# Patient Record
Sex: Male | Born: 1950 | Race: White | Hispanic: No | Marital: Married | Smoking: Never smoker
Health system: Southern US, Community
[De-identification: ages and names within clinical notes are randomized; demographics above are authoritative.]

## PROBLEM LIST (undated history)

## (undated) DIAGNOSIS — K219 Gastro-esophageal reflux disease without esophagitis: Secondary | ICD-10-CM

## (undated) DIAGNOSIS — E785 Hyperlipidemia, unspecified: Secondary | ICD-10-CM

## (undated) DIAGNOSIS — I10 Essential (primary) hypertension: Secondary | ICD-10-CM

## (undated) HISTORY — PX: CATARACT EXTRACTION: SUR2

## (undated) HISTORY — PX: POLYPECTOMY: SHX149

---

## 1998-09-16 ENCOUNTER — Emergency Department (HOSPITAL_COMMUNITY): Admission: EM | Admit: 1998-09-16 | Discharge: 1998-09-17 | Payer: Self-pay | Admitting: Internal Medicine

## 2003-03-28 ENCOUNTER — Ambulatory Visit (HOSPITAL_COMMUNITY): Admission: RE | Admit: 2003-03-28 | Discharge: 2003-03-28 | Payer: Self-pay | Admitting: *Deleted

## 2005-02-14 ENCOUNTER — Ambulatory Visit (HOSPITAL_COMMUNITY): Admission: RE | Admit: 2005-02-14 | Discharge: 2005-02-14 | Payer: Self-pay | Admitting: Ophthalmology

## 2005-02-16 ENCOUNTER — Emergency Department (HOSPITAL_COMMUNITY): Admission: RE | Admit: 2005-02-16 | Discharge: 2005-02-16 | Payer: Self-pay | Admitting: Ophthalmology

## 2011-07-08 ENCOUNTER — Encounter (HOSPITAL_COMMUNITY): Payer: Self-pay | Admitting: Emergency Medicine

## 2011-07-08 ENCOUNTER — Emergency Department (HOSPITAL_COMMUNITY)
Admission: EM | Admit: 2011-07-08 | Discharge: 2011-07-08 | Disposition: A | Discharge status: Home / Self Care | Payer: BC Managed Care – PPO | Attending: Emergency Medicine | Admitting: Emergency Medicine

## 2011-07-08 ENCOUNTER — Emergency Department (HOSPITAL_COMMUNITY): Payer: BC Managed Care – PPO

## 2011-07-08 DIAGNOSIS — I10 Essential (primary) hypertension: Secondary | ICD-10-CM | POA: Insufficient documentation

## 2011-07-08 DIAGNOSIS — R5383 Other fatigue: Secondary | ICD-10-CM | POA: Insufficient documentation

## 2011-07-08 DIAGNOSIS — H9190 Unspecified hearing loss, unspecified ear: Secondary | ICD-10-CM | POA: Insufficient documentation

## 2011-07-08 DIAGNOSIS — M62838 Other muscle spasm: Secondary | ICD-10-CM | POA: Insufficient documentation

## 2011-07-08 DIAGNOSIS — R5381 Other malaise: Secondary | ICD-10-CM | POA: Insufficient documentation

## 2011-07-08 DIAGNOSIS — Z79899 Other long term (current) drug therapy: Secondary | ICD-10-CM | POA: Insufficient documentation

## 2011-07-08 DIAGNOSIS — R6884 Jaw pain: Secondary | ICD-10-CM | POA: Insufficient documentation

## 2011-07-08 HISTORY — DX: Essential (primary) hypertension: I10

## 2011-07-08 LAB — CBC WITH DIFFERENTIAL/PLATELET
Basophils Absolute: 0 10*3/uL (ref 0.0–0.1)
Basophils Relative: 0 % (ref 0–1)
Eosinophils Absolute: 0.1 10*3/uL (ref 0.0–0.7)
Eosinophils Relative: 1 % (ref 0–5)
HCT: 40.7 % (ref 39.0–52.0)
Hemoglobin: 14.3 g/dL (ref 13.0–17.0)
Lymphocytes Relative: 23 % (ref 12–46)
Lymphs Abs: 2.2 10*3/uL (ref 0.7–4.0)
MCH: 32.1 pg (ref 26.0–34.0)
MCHC: 35.1 g/dL (ref 30.0–36.0)
MCV: 91.5 fL (ref 78.0–100.0)
Monocytes Absolute: 1.1 10*3/uL — ABNORMAL HIGH (ref 0.1–1.0)
Monocytes Relative: 12 % (ref 3–12)
Neutro Abs: 6 10*3/uL (ref 1.7–7.7)
Neutrophils Relative %: 64 % (ref 43–77)
Platelets: 176 10*3/uL (ref 150–400)
RBC: 4.45 MIL/uL (ref 4.22–5.81)
RDW: 12.5 % (ref 11.5–15.5)
WBC: 9.3 10*3/uL (ref 4.0–10.5)

## 2011-07-08 LAB — POCT I-STAT, CHEM 8
BUN: 21 mg/dL (ref 6–23)
Calcium, Ion: 1.16 mmol/L (ref 1.12–1.32)
Chloride: 102 mEq/L (ref 96–112)
Creatinine, Ser: 1.1 mg/dL (ref 0.50–1.35)
Glucose, Bld: 103 mg/dL — ABNORMAL HIGH (ref 70–99)
HCT: 43 % (ref 39.0–52.0)
Hemoglobin: 14.6 g/dL (ref 13.0–17.0)
Potassium: 3.7 mEq/L (ref 3.5–5.1)
Sodium: 137 mEq/L (ref 135–145)
TCO2: 22 mmol/L (ref 0–100)

## 2011-07-08 MED ORDER — HYDROCODONE-ACETAMINOPHEN 5-500 MG PO TABS: 1.0000 | ORAL_TABLET | Freq: Four times a day (QID) | ORAL | Status: AC | PRN

## 2011-07-08 MED ORDER — DIAZEPAM 5 MG/ML IJ SOLN
5.0000 mg | Freq: Once | INTRAMUSCULAR | Status: AC
  Administered 2011-07-08: 5 mg via INTRAVENOUS
  Filled 2011-07-08: qty 2

## 2011-07-08 MED ORDER — HYDROCODONE-ACETAMINOPHEN 5-325 MG PO TABS: 1.0000 | ORAL_TABLET | Freq: Once | ORAL | Status: DC

## 2011-07-08 MED ORDER — IOHEXOL 300 MG/ML  SOLN
75.0000 mL | Freq: Once | INTRAMUSCULAR | Status: AC | PRN
  Administered 2011-07-08: 75 mL via INTRAVENOUS

## 2011-07-08 MED ORDER — DIAZEPAM 5 MG PO TABS: 5.0000 mg | ORAL_TABLET | Freq: Three times a day (TID) | ORAL | Status: AC | PRN

## 2011-07-08 MED ORDER — DIAZEPAM 5 MG PO TABS: 5.0000 mg | ORAL_TABLET | Freq: Once | ORAL | Status: DC

## 2011-07-08 NOTE — Discharge Instructions (Signed)
Muscle Cramps Muscle cramps are when muscles tighten by themselves. Muscle cramps usually improve or go away within minutes. HOME CARE  Massage the muscle.   Stretch the muscle.   Relax the muscle.   Only take medicine as told by your doctor.   Drink enough fluids to keep your pee (urine) clear or pale yellow.  GET HELP RIGHT AWAY IF:  Cramps are frequent and do not get better with medicine. MAKE SURE YOU:  Understand these intructions.   Will watch your condition.   Will get help right away if your are not doing well or get worse.  Document Released: 12/10/2007 Document Revised: 12/16/2010 Document Reviewed: 12/19/2007 [facility] Patient Information 2012 [place], [place]. As discussed, we would like you to return in 24 hours for recheck, sooner if you develop new symptoms such as muscle spasm in your chest, muscles, increased difficulty swallowing.  Muscle spasms in her arms or legs

## 2011-07-08 NOTE — ED Notes (Signed)
Pt d/c home in NAD. Pt voiced understanding of d/c instructions and need for follow up care. Pt instructed not to drive on valium or narcotics.

## 2011-07-08 NOTE — ED Notes (Signed)
Co sore throat and difficulty swallowing x 2 days.  Sent from PCP for elevated WBC.

## 2011-07-08 NOTE — ED Provider Notes (Signed)
History     CSN: [id number]  Arrival date & time 07/08/11  1840   None     Chief Complaint  Patient presents with  . Sore Throat    (Consider location/radiation/quality/duration/timing/severity/associated sxs/prior treatment) HPI Comments: Patient has noticed for the last 2, days has been unable to fully open his mouth and he is having left sided jaw pain.  He did go to his dentist yesterday who did x-rays, revealing no dental abscess.  He was placed on amoxicillin by his dentist "just in case" he went to his primary care doctor because he was not getting any better and now the pain is in his ear, as well as into his neck.  He had blood work at the office, which revealed a WBC count of 12.1, and was sent to the emergency department for further evaluation.  His wife reports that he has been mumbling for the past 3 or 4 days, and feeling fatigued, which is very unusual for him.  He denies fever, chills, headache, dizziness, numbness, or tingling.  Injury to that side of his face or neck.  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 male presenting with pharyngitis. The history is provided by the patient.  Sore Throat This is a new problem. The current episode started in the past 7 days. The problem occurs constantly. The problem has been gradually worsening. Associated symptoms include fatigue and a sore throat. Pertinent negatives include no congestion, coughing, fever, headaches, joint swelling, myalgias, nausea, neck pain, numbness, visual change, vomiting or weakness. The symptoms are aggravated by swallowing. He has tried nothing for the symptoms. The treatment provided no relief.    Past Medical History  Diagnosis Date  . Hypertension     History reviewed. No pertinent past surgical history.  No family history on file.  History  Substance Use Topics  . Smoking status: Never Smoker   .  Smokeless tobacco: Not on file  . Alcohol Use: No      Review of Systems  Constitutional: Positive for fatigue. Negative for fever.  HENT: Positive for ear pain, sore throat and voice change. Negative for congestion, facial swelling, rhinorrhea, drooling, trouble swallowing, neck pain, neck stiffness, dental problem, sinus pressure, tinnitus and ear discharge.   Eyes: Negative for visual disturbance.  Respiratory: Negative for cough and shortness of breath.   Gastrointestinal: Negative for nausea and vomiting.  Musculoskeletal: Negative for myalgias and joint swelling.  Neurological: Negative for dizziness, facial asymmetry, speech difficulty, weakness, numbness and headaches.    Allergies  Review of patient's allergies indicates no known allergies.  Home Medications   Current Outpatient Rx  Name Route Sig Dispense Refill  . AMOXICILLIN 500 MG PO TABS Oral Take 500 mg by mouth 4 (four) times daily.    Marland Kitchen HYDROCHLOROTHIAZIDE 25 MG PO TABS Oral Take 25 mg by mouth daily.    Marland Kitchen LISINOPRIL 40 MG PO TABS Oral Take 40 mg by mouth 2 (two) times daily.    Marland Kitchen PANTOPRAZOLE SODIUM 40 MG PO TBEC Oral Take 40 mg by mouth 2 (two) times daily.    Marland Kitchen PRAVASTATIN SODIUM 40 MG PO TABS Oral Take 40 mg by mouth at bedtime.    . TRAMADOL HCL 50 MG PO TABS Oral Take 50 mg by mouth every 6 (six) hours as needed.      BP 108/88  Pulse 94  Temp  98.8 F (37.1 C)  Resp 24  SpO2 100%  Physical Exam  Constitutional: He is oriented to person, place, and time. He appears well-developed.  HENT:  Head: There is trismus in the jaw.  Right Ear: Hearing, tympanic membrane, external ear and ear canal normal.  Left Ear: Tympanic membrane, external ear and ear canal normal. Decreased hearing is noted.       Due to patient's inability to open his mouth.  He cannot visualize his posterior pharynx or uvula, or under the tongue  Eyes: Pupils are equal, round, and reactive to light.  Neck: Normal range of motion. No  JVD present. No tracheal deviation present. No thyromegaly present.  Cardiovascular: Normal rate.   Pulmonary/Chest: No stridor.  Abdominal: Soft.  Musculoskeletal: Normal range of motion.  Lymphadenopathy:    He has no cervical adenopathy.  Neurological: He is alert and oriented to person, place, and time.  Skin: Skin is warm.    ED Course  Procedures (including critical care time)  Labs Reviewed  POCT I-STAT, CHEM 8 - Abnormal; Notable for the following:    Glucose, Bld 103 (*)     All other components within normal limits  CBC WITH DIFFERENTIAL   No results found.   No diagnosis found.    MDM   Due to patient's physical exam slightly elevated white count.  Concern for a posterior pharyngeal abscess.  Subglottal mass.  Will obtain CT of neck with contrast  CT scan is normal, with both TMs in place.  Patient was given IV, Valium, without change in his symptoms.  This was discussed with Dr. Iantha Fallen.  I will send this patient home with pain medication, as well as by mouth Valium, and have him return in 24 hours for recheck.  Upon further investigation.  It was found that he did have a laceration to his thumb approximately 10 days ago and was updated on his tetanus but he is unsure when his tetanus before that was last updated there is a mild possibility that this patient has a mild form of tetanus he has been instructed to return if he develops any new symptoms.  Difficulty swallowing, pain in his anterior chest, or calves.  Severe.  Muscle spasms that are not controlled with Valium        Arman Filter, NP 07/08/11 2309

## 2011-07-08 NOTE — ED Provider Notes (Signed)
Pt seen and examined.  Medical screening examination/treatment/procedure(s) were conducted as a shared visit with non-physician practitioner(s) and myself.  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 I did consider the possibility of tetanus considering his jaw complaints however he has no other signs of muscle rigidity. He is not febrile. He does not appear toxic. He has had his tetanus vaccinations in the past and after the recent injury did have an update. Obviously his overall low risk. We will treat him for muscle spasms and have him return to the emergency room to be rechecked. At this time do not feel there is enough symptoms to warrant inpatient admission and treatment.  Celene Kras, MD 07/08/11 2300

## 2011-07-08 NOTE — ED Notes (Signed)
Per pt wife, neck is swollen. Pt has had difficulty opening mouth x2days. Pt c/o sore throat and states that it is painful to swallow. Pt denies any airway obstruction.

## 2011-07-09 ENCOUNTER — Emergency Department (HOSPITAL_BASED_OUTPATIENT_CLINIC_OR_DEPARTMENT_OTHER)
Admission: EM | Admit: 2011-07-09 | Discharge: 2011-07-09 | Disposition: A | Discharge status: Home / Self Care | Payer: BC Managed Care – PPO | Attending: Emergency Medicine | Admitting: Emergency Medicine

## 2011-07-09 ENCOUNTER — Encounter (HOSPITAL_BASED_OUTPATIENT_CLINIC_OR_DEPARTMENT_OTHER): Payer: Self-pay

## 2011-07-09 DIAGNOSIS — R6884 Jaw pain: Secondary | ICD-10-CM | POA: Insufficient documentation

## 2011-07-09 DIAGNOSIS — I10 Essential (primary) hypertension: Secondary | ICD-10-CM | POA: Insufficient documentation

## 2011-07-09 MED ORDER — DIAZEPAM 5 MG PO TABS
5.0000 mg | ORAL_TABLET | Freq: Once | ORAL | Status: AC
  Administered 2011-07-09: 5 mg via ORAL
  Filled 2011-07-09: qty 1

## 2011-07-09 NOTE — ED Provider Notes (Signed)
History     CSN: [id number]  Arrival date & time 07/09/11  1226   First MD Initiated Contact with Patient 07/09/11 1246      Chief Complaint  Patient presents with  . Jaw Pain    (Consider location/radiation/quality/duration/timing/severity/associated sxs/prior treatment) HPI Comments: Pt states that he has had jaw pain since 5 days ago:pt saw his dentist and was given amoxicillin pt then saw his pcp yesterday and had an elevated wbc and was sent to the er for further evaluation:pt denies fever or swelling:pt states in the er last night they did a ct scan:pt states that he took one dose of pain medication this morning and it doesn't necessarily feel better which is what brings him in today:pt states that he has left jaw tenderness when he tries to move the area:pt states that he has been able to eat it is just very sore  The history is provided by the patient. No language interpreter was used.    Past Medical History  Diagnosis Date  . Hypertension     Past Surgical History  Procedure Date  . Cataract extraction     No family history on file.  History  Substance Use Topics  . Smoking status: Never Smoker   . Smokeless tobacco: Not on file  . Alcohol Use: No      Review of Systems  Constitutional: Negative.   Respiratory: Negative.   Cardiovascular: Negative.     Allergies  Review of patient's allergies indicates no known allergies.  Home Medications   Current Outpatient Rx  Name Route Sig Dispense Refill  . AMOXICILLIN 500 MG PO TABS Oral Take 500 mg by mouth 4 (four) times daily.    Marland Kitchen DIAZEPAM 5 MG PO TABS Oral Take 1 tablet (5 mg total) by mouth every 8 (eight) hours as needed for anxiety. 10 tablet 0  . HYDROCHLOROTHIAZIDE 25 MG PO TABS Oral Take 25 mg by mouth daily.    Marland Kitchen LISINOPRIL 40 MG PO TABS Oral Take 40 mg by mouth 2 (two) times daily.    Marland Kitchen PANTOPRAZOLE SODIUM 40 MG PO TBEC Oral Take 40 mg by mouth 2 (two) times daily.    Marland Kitchen PRAVASTATIN SODIUM 40 MG  PO TABS Oral Take 40 mg by mouth at bedtime.    . TRAMADOL HCL 50 MG PO TABS Oral Take 50 mg by mouth every 6 (six) hours as needed.    Marland Kitchen HYDROCODONE-ACETAMINOPHEN 5-500 MG PO TABS Oral Take 1-2 tablets by mouth every 6 (six) hours as needed for pain. 15 tablet 0    BP 111/78  Pulse 102  Temp 98.2 F (36.8 C) (Oral)  Resp 18  Ht 5\' 8"  (1.727 m)  Wt 207 lb (93.895 kg)  BMI 31.47 kg/m2  SpO2 98%  Physical Exam  Nursing note and vitals reviewed. Constitutional: He is oriented to person, place, and time. He appears well-developed and well-nourished.  HENT:  Head: Normocephalic and atraumatic.  Right Ear: External ear normal.  Left Ear: External ear normal.       No gum or oropharyngeal swelling noted  Cardiovascular: Normal rate and regular rhythm.   Pulmonary/Chest: Effort normal and breath sounds normal.  Musculoskeletal: Normal range of motion.  Neurological: He is alert and oriented to person, place, and time.    ED Course  Procedures (including critical care time)  Labs Reviewed - No data to display    1. Jaw pain       MDM  Pt feeling  a little better with the valium:reviewed ct from last night:don't think further imaging is needed at this time:pt has valium and hydrocodone at home        Teressa Lower, NP 07/09/11 1503

## 2011-07-09 NOTE — ED Notes (Signed)
PT c/o jaw pain, sore throat and inability to open mouth.  Pt states onset of SX Tuesday. Pt seen at [facility] yesterday and told to FU with ED again in 24 hours.  Pt had accident with bilateral thumbs a couple of weeks ago, TX with Tetanus onset of SX approx 6 days later.

## 2011-07-13 NOTE — ED Provider Notes (Signed)
Medical screening examination/treatment/procedure(s) were conducted as a shared visit with non-physician practitioner(s) and myself.  I personally evaluated the patient during the encounter  Cyndra Numbers, MD 07/13/11 1325

## 2011-12-18 ENCOUNTER — Emergency Department (HOSPITAL_BASED_OUTPATIENT_CLINIC_OR_DEPARTMENT_OTHER): Payer: BC Managed Care – PPO

## 2011-12-18 ENCOUNTER — Encounter (HOSPITAL_BASED_OUTPATIENT_CLINIC_OR_DEPARTMENT_OTHER): Payer: Self-pay | Admitting: *Deleted

## 2011-12-18 ENCOUNTER — Emergency Department (HOSPITAL_BASED_OUTPATIENT_CLINIC_OR_DEPARTMENT_OTHER)
Admission: EM | Admit: 2011-12-18 | Discharge: 2011-12-18 | Disposition: A | Discharge status: Home / Self Care | Payer: BC Managed Care – PPO | Attending: Emergency Medicine | Admitting: Emergency Medicine

## 2011-12-18 DIAGNOSIS — Y92009 Unspecified place in unspecified non-institutional (private) residence as the place of occurrence of the external cause: Secondary | ICD-10-CM | POA: Insufficient documentation

## 2011-12-18 DIAGNOSIS — K529 Noninfective gastroenteritis and colitis, unspecified: Secondary | ICD-10-CM

## 2011-12-18 DIAGNOSIS — R55 Syncope and collapse: Secondary | ICD-10-CM | POA: Insufficient documentation

## 2011-12-18 DIAGNOSIS — Y93E8 Activity, other personal hygiene: Secondary | ICD-10-CM | POA: Insufficient documentation

## 2011-12-18 DIAGNOSIS — K5289 Other specified noninfective gastroenteritis and colitis: Secondary | ICD-10-CM | POA: Insufficient documentation

## 2011-12-18 DIAGNOSIS — S022XXA Fracture of nasal bones, initial encounter for closed fracture: Secondary | ICD-10-CM | POA: Insufficient documentation

## 2011-12-18 DIAGNOSIS — S0120XA Unspecified open wound of nose, initial encounter: Secondary | ICD-10-CM | POA: Insufficient documentation

## 2011-12-18 DIAGNOSIS — Z79899 Other long term (current) drug therapy: Secondary | ICD-10-CM | POA: Insufficient documentation

## 2011-12-18 DIAGNOSIS — S0121XA Laceration without foreign body of nose, initial encounter: Secondary | ICD-10-CM

## 2011-12-18 DIAGNOSIS — I10 Essential (primary) hypertension: Secondary | ICD-10-CM | POA: Insufficient documentation

## 2011-12-18 DIAGNOSIS — W1811XA Fall from or off toilet without subsequent striking against object, initial encounter: Secondary | ICD-10-CM | POA: Insufficient documentation

## 2011-12-18 LAB — CBC WITH DIFFERENTIAL/PLATELET
Eosinophils Relative: 1 % (ref 0–5)
HCT: 46.2 % (ref 39.0–52.0)
Hemoglobin: 16.7 g/dL (ref 13.0–17.0)
Lymphocytes Relative: 12 % (ref 12–46)
Lymphs Abs: 2 10*3/uL (ref 0.7–4.0)
MCV: 89.2 fL (ref 78.0–100.0)
Monocytes Absolute: 1 10*3/uL (ref 0.1–1.0)
RBC: 5.18 MIL/uL (ref 4.22–5.81)
WBC: 16.1 10*3/uL — ABNORMAL HIGH (ref 4.0–10.5)

## 2011-12-18 LAB — APTT: aPTT: 25 seconds (ref 24–37)

## 2011-12-18 LAB — COMPREHENSIVE METABOLIC PANEL
ALT: 13 U/L (ref 0–53)
CO2: 18 mEq/L — ABNORMAL LOW (ref 19–32)
Calcium: 9.5 mg/dL (ref 8.4–10.5)
Creatinine, Ser: 1 mg/dL (ref 0.50–1.35)
GFR calc Af Amer: >90 mL/min (ref >90)
GFR calc non Af Amer: 79 mL/min — ABNORMAL LOW (ref >90)
Glucose, Bld: 189 mg/dL — ABNORMAL HIGH (ref 70–99)
Sodium: 135 mEq/L (ref 135–145)

## 2011-12-18 MED ORDER — HYDROCODONE-ACETAMINOPHEN 5-325 MG PO TABS: 1.0000 | ORAL_TABLET | ORAL | Status: DC | PRN

## 2011-12-18 MED ORDER — CLINDAMYCIN HCL 150 MG PO CAPS: 300.0000 mg | ORAL_CAPSULE | Freq: Three times a day (TID) | ORAL | Status: DC

## 2011-12-18 MED ORDER — SODIUM CHLORIDE 0.9 % IV BOLUS (SEPSIS)
1000.0000 mL | Freq: Once | INTRAVENOUS | Status: AC
  Administered 2011-12-18: 1000 mL via INTRAVENOUS

## 2011-12-18 MED ORDER — ONDANSETRON 4 MG PO TBDP: 4.0000 mg | ORAL_TABLET | Freq: Three times a day (TID) | ORAL | Status: DC | PRN

## 2011-12-18 MED ORDER — LIDOCAINE HCL 2 % IJ SOLN
INTRAMUSCULAR | Status: AC
  Administered 2011-12-18: 400 mg
  Filled 2011-12-18: qty 20

## 2011-12-18 NOTE — ED Notes (Signed)
Patient ambulates halfway around the department without difficulty or assistance. Vital signs stable. No complaints of dizziness or nausea.

## 2011-12-18 NOTE — ED Notes (Signed)
Placed on 35% AFT for supplemental O2.  SpO2 increased to 97%.

## 2011-12-18 NOTE — ED Notes (Signed)
Patient transported to CT 

## 2011-12-18 NOTE — ED Notes (Signed)
Pt with nausea and diarrhea today- per pt's wife he passed out in the bathroom and hit face on door frame- laceration present to bridge of nose and right nare

## 2011-12-18 NOTE — ED Provider Notes (Signed)
History  This chart was scribed for Loren Racer, MD by Bennett Scrape, ED Scribe. This patient was seen in room MH07/MH07 and the patient's care was started at 5:50 PM.  CSN: [id number]  Arrival date & time 12/18/11  1732   First MD Initiated Contact with Patient 12/18/11 1750      Chief Complaint  Patient presents with  . Loss of Consciousness  . Facial Injury     Patient is a 61 y.o. male presenting with syncope. The history is provided by the patient. No language interpreter was used.  Loss of Consciousness This is a new problem. The current episode started 1 to 2 hours ago. Episode frequency: one episode today. The problem has been resolved. Pertinent negatives include no chest pain, no abdominal pain, no headaches and no shortness of breath. Nothing aggravates the symptoms. Nothing relieves the symptoms. He has tried nothing for the symptoms.    Johnathan [name] is a 61 y.o. male who presents to the Emergency Department complaining of sudden onset, non-changing, constant neck pain and lacerations to the bridge of his nose and right nare after one syncopal episode while on the toilet PTA. Per wife, pt passed out in the bathroom while sitting on the toilet while having diarrhea and hit his face on the door frame. She reports that the pt was unconscious about 1 to 2 minutes. The bleeding to the lacerations are controlled. Pt states that he remember sitting on the toilet having diarrhea and then waking up on the floor. He reports that he has been having nausea and watery diarrhea today. He denies any suspect food intakes and reports that his wife has similar symptoms of nausea and diarrhea at home. He reports prior episodes of syncope when having pain and/or BMs. He denies leg swelling, calf pain, emesis, CP and HA as associated symptoms.  He has a h/o HTN and denies smoking and alcohol use. TD vaccination is UTD, within the past year.  Past Medical History  Diagnosis Date  .  Hypertension     Past Surgical History  Procedure Date  . Cataract extraction     No family history on file.  History  Substance Use Topics  . Smoking status: Never Smoker   . Smokeless tobacco: Never Used  . Alcohol Use: No      Review of Systems  Constitutional: Negative for fever and chills.  HENT: Positive for neck pain. Negative for trouble swallowing.   Respiratory: Negative for shortness of breath.   Cardiovascular: Positive for syncope. Negative for chest pain.  Gastrointestinal: Positive for nausea and diarrhea. Negative for vomiting and abdominal pain.  Skin: Positive for wound (lacerations to the nose).  Neurological: Negative for headaches.  All other systems reviewed and are negative.    Allergies  Review of patient's allergies indicates no known allergies.  Home Medications   Current Outpatient Rx  Name  Route  Sig  Dispense  Refill  . HYDROCHLOROTHIAZIDE 25 MG PO TABS   Oral   Take 25 mg by mouth daily.         Marland Kitchen LISINOPRIL 40 MG PO TABS   Oral   Take 40 mg by mouth 2 (two) times daily.         Marland Kitchen PANTOPRAZOLE SODIUM 40 MG PO TBEC   Oral   Take 40 mg by mouth 2 (two) times daily.         Marland Kitchen PRAVASTATIN SODIUM 40 MG PO TABS   Oral  Take 40 mg by mouth at bedtime.         . TRAMADOL HCL 50 MG PO TABS   Oral   Take 50 mg by mouth every 6 (six) hours as needed.         . AMOXICILLIN 500 MG PO TABS   Oral   Take 500 mg by mouth 4 (four) times daily.         Marland Kitchen CLINDAMYCIN HCL 150 MG PO CAPS   Oral   Take 2 capsules (300 mg total) by mouth 3 (three) times daily.   21 capsule   0   . HYDROCODONE-ACETAMINOPHEN 5-325 MG PO TABS   Oral   Take 1 tablet by mouth every 4 (four) hours as needed for pain.   15 tablet   0   . ONDANSETRON 4 MG PO TBDP   Oral   Take 1 tablet (4 mg total) by mouth every 8 (eight) hours as needed for nausea.   20 tablet   0     Triage Vitals: BP 98/65  Pulse 87  Temp 97.8 F (36.6 C) (Oral)   Resp 20  SpO2 100%  Physical Exam  Nursing note and vitals reviewed. Constitutional: He is oriented to person, place, and time. He appears well-developed and well-nourished. No distress.  HENT:  Head: Normocephalic.       Abrasion to the mid lower lip, through and through 2 cm laceration of the right nare, irregular 2 cm laceration at the base of nose  Eyes: Conjunctivae normal and EOM are normal. Pupils are equal, round, and reactive to light.       Pupils are 2 mm and equal bilaterally  Neck: Neck supple. No tracheal deviation present.       Mild diffuse neck tenderness, no focal tenderness  Cardiovascular: Normal rate and regular rhythm.   No murmur heard. Pulmonary/Chest: Effort normal and breath sounds normal. No respiratory distress.  Abdominal: Soft. He exhibits no distension. There is no tenderness.  Musculoskeletal: Normal range of motion. He exhibits no edema.  Neurological: He is alert and oriented to person, place, and time.       Strength is 5/5 throughout, sensation is intact  Skin: Skin is warm and dry.  Psychiatric: He has a normal mood and affect. His behavior is normal.    ED Course  Procedures (including critical care time)   Date: 12/18/2011  Rate: 78  Rhythm: normal sinus rhythm  QRS Axis: normal  Intervals: normal  ST/T Wave abnormalities: nonspecific T wave changes  Conduction Disutrbances:none  Narrative Interpretation: nonspecific T wave changes  Old EKG Reviewed: none available   DIAGNOSTIC STUDIES: Oxygen Saturation is 100% on room air, normal by my interpretation.    COORDINATION OF CARE: 6:25 PM- Discussed treatment plan which includes CT scan with pt at bedside and pt agreed to plan. Advised pt that he may need to follow up with a plastic surgeon for his nose lacerations.   7:55 PM- Consult complete with Dr. Jenne Pane. Patient case explained and discussed. Dr. Jenne Pane recommends laceration repair in the ED and agrees to evaluate the patient in  office in 5 days. Call ended at 7:57 PM.   8:10 PM-Informed pt of consult with Dr. Jenne Pane and plan to perform laceration procedure. Pt agreed to plan.  LACERATION REPAIR Performed by: Loren Racer, MD at 8:15 PM Consent: Verbal consent obtained. Risks and benefits: risks, benefits and alternatives were discussed Patient identity confirmed: provided demographic data Time out performed  prior to procedure Prepped and Draped in normal sterile fashion Wound explored Laceration Location: right nare and base of nose Laceration Length: both are 2 cm No Foreign Bodies seen or palpated Anesthesia: local infiltration Local anesthetic: lidocaine 1 % without epinephrine Anesthetic total: 3 ml injected into the infraorbital region and supraorbital region, 1-2 mL located Irrigation method: syringe Amount of cleaning: standard Skin closure: well approximated  Number of sutures or staples: 3 5-0  polyprolene sutures and 3 6-0 prolene sutures to the laceration at the base of the nose, no active bleeding, re-approximated nasal aperture for right nare, used 5 6-0 prolene sutures, mild duskiness at distal end of nasal flap otherwise good vascular markings, no active bleeding Technique: interrupted  Patient tolerance: Patient tolerated the procedure well with no immediate complications.  Bacitracin  Applied. Advised pt to not blow nose and take antibiotics as prescribed. Follow up with ENT as prescribed in 5 days.  10:00 PM- Pt is up and ambulating around. He is requesting discharge.  Labs Reviewed  CBC WITH DIFFERENTIAL - Abnormal; Notable for the following:    WBC 16.1 (*)     MCHC 36.1 (*)     Neutrophils Relative 81 (*)     Neutro Abs 13.1 (*)     All other components within normal limits  COMPREHENSIVE METABOLIC PANEL - Abnormal; Notable for the following:    CO2 18 (*)     Glucose, Bld 189 (*)     Total Protein 8.6 (*)     GFR calc non Af Amer 79 (*)     All other components within normal  limits  PROTIME-INR  APTT  TROPONIN I  URINALYSIS, ROUTINE W REFLEX MICROSCOPIC   Ct Head Wo Contrast  12/18/2011  *RADIOLOGY REPORT*  Clinical Data:  Fall.  Facial trauma.  Loss of consciousness. Abrasions to the forehead and lacerations of the face.  CT HEAD WITHOUT CONTRAST CT MAXILLOFACIAL WITHOUT CONTRAST CT CERVICAL SPINE WITHOUT CONTRAST  Technique:  Multidetector CT imaging of the head, cervical spine, and maxillofacial structures were performed using the standard protocol without intravenous contrast. Multiplanar CT image reconstructions of the cervical spine and maxillofacial structures were also generated.  Comparison:  07/08/2011 soft tissue neck CT.  CT HEAD  Findings: No mass lesion, mass effect, midline shift, hydrocephalus, hemorrhage.  No territorial ischemia or acute infarction.  Calvarium intact.  Mastoid air cells clear. Mandibular condyles appear located.  Zygomatic arches intact.  The frontal sinuses are intact and clear.  There is a high attenuation subcutaneous lesion in the right posterior neck (image 3 series 2), which appears unchanged compared to recent CT 07/08/2011.  IMPRESSION: Negative CT brain.  CT MAXILLOFACIAL  Findings:  Bilateral mildly displaced nasal bone fractures are present with anterior position of the tip of the nasal bones relative to the the more superior nasal bones.  Nasal process of the maxilla appears intact.  Zygomatic arches intact.  Pterygoid plates intact.  Mandibular condyles located.  Significant dental artifact is present.  Laceration is present over the bridge of the nose.  Possible second laceration adjacent to the right nasal ala. Bilateral lens extractions.  Globes and orbits appear intact. Maxillary sinuses intact.  Small bilateral maxillary mucous retention cyst/polyp.  Mastoid air cells clear. Soft tissue swelling is present over the right side of the philtrum.  IMPRESSION: Facial lacerations and bilateral mildly displaced nasal bone fractures.   CT CERVICAL SPINE  Findings:   Cervical spinal alignment shows reversal at C3-C4. Multilevel  disc osteophyte complexes are present.  Bilateral uncovertebral spurring with multilevel foraminal encroachment. There is no cervical spine fracture or dislocation.  IMPRESSION: No acute abnormality.  Multilevel cervical spondylosis.   Original Report Authenticated By: Andreas Newport, M.D.    Ct Cervical Spine Wo Contrast  12/18/2011  *RADIOLOGY REPORT*  Clinical Data:  Fall.  Facial trauma.  Loss of consciousness. Abrasions to the forehead and lacerations of the face.  CT HEAD WITHOUT CONTRAST CT MAXILLOFACIAL WITHOUT CONTRAST CT CERVICAL SPINE WITHOUT CONTRAST  Technique:  Multidetector CT imaging of the head, cervical spine, and maxillofacial structures were performed using the standard protocol without intravenous contrast. Multiplanar CT image reconstructions of the cervical spine and maxillofacial structures were also generated.  Comparison:  07/08/2011 soft tissue neck CT.  CT HEAD  Findings: No mass lesion, mass effect, midline shift, hydrocephalus, hemorrhage.  No territorial ischemia or acute infarction.  Calvarium intact.  Mastoid air cells clear. Mandibular condyles appear located.  Zygomatic arches intact.  The frontal sinuses are intact and clear.  There is a high attenuation subcutaneous lesion in the right posterior neck (image 3 series 2), which appears unchanged compared to recent CT 07/08/2011.  IMPRESSION: Negative CT brain.  CT MAXILLOFACIAL  Findings:  Bilateral mildly displaced nasal bone fractures are present with anterior position of the tip of the nasal bones relative to the the more superior nasal bones.  Nasal process of the maxilla appears intact.  Zygomatic arches intact.  Pterygoid plates intact.  Mandibular condyles located.  Significant dental artifact is present.  Laceration is present over the bridge of the nose.  Possible second laceration adjacent to the right nasal ala. Bilateral lens  extractions.  Globes and orbits appear intact. Maxillary sinuses intact.  Small bilateral maxillary mucous retention cyst/polyp.  Mastoid air cells clear. Soft tissue swelling is present over the right side of the philtrum.  IMPRESSION: Facial lacerations and bilateral mildly displaced nasal bone fractures.  CT CERVICAL SPINE  Findings:   Cervical spinal alignment shows reversal at C3-C4. Multilevel disc osteophyte complexes are present.  Bilateral uncovertebral spurring with multilevel foraminal encroachment. There is no cervical spine fracture or dislocation.  IMPRESSION: No acute abnormality.  Multilevel cervical spondylosis.   Original Report Authenticated By: Andreas Newport, M.D.    Ct Maxillofacial Wo Cm  12/18/2011  *RADIOLOGY REPORT*  Clinical Data:  Fall.  Facial trauma.  Loss of consciousness. Abrasions to the forehead and lacerations of the face.  CT HEAD WITHOUT CONTRAST CT MAXILLOFACIAL WITHOUT CONTRAST CT CERVICAL SPINE WITHOUT CONTRAST  Technique:  Multidetector CT imaging of the head, cervical spine, and maxillofacial structures were performed using the standard protocol without intravenous contrast. Multiplanar CT image reconstructions of the cervical spine and maxillofacial structures were also generated.  Comparison:  07/08/2011 soft tissue neck CT.  CT HEAD  Findings: No mass lesion, mass effect, midline shift, hydrocephalus, hemorrhage.  No territorial ischemia or acute infarction.  Calvarium intact.  Mastoid air cells clear. Mandibular condyles appear located.  Zygomatic arches intact.  The frontal sinuses are intact and clear.  There is a high attenuation subcutaneous lesion in the right posterior neck (image 3 series 2), which appears unchanged compared to recent CT 07/08/2011.  IMPRESSION: Negative CT brain.  CT MAXILLOFACIAL  Findings:  Bilateral mildly displaced nasal bone fractures are present with anterior position of the tip of the nasal bones relative to the the more superior nasal  bones.  Nasal process of the maxilla appears intact.  Zygomatic arches intact.  Pterygoid plates intact.  Mandibular condyles located.  Significant dental artifact is present.  Laceration is present over the bridge of the nose.  Possible second laceration adjacent to the right nasal ala. Bilateral lens extractions.  Globes and orbits appear intact. Maxillary sinuses intact.  Small bilateral maxillary mucous retention cyst/polyp.  Mastoid air cells clear. Soft tissue swelling is present over the right side of the philtrum.  IMPRESSION: Facial lacerations and bilateral mildly displaced nasal bone fractures.  CT CERVICAL SPINE  Findings:   Cervical spinal alignment shows reversal at C3-C4. Multilevel disc osteophyte complexes are present.  Bilateral uncovertebral spurring with multilevel foraminal encroachment. There is no cervical spine fracture or dislocation.  IMPRESSION: No acute abnormality.  Multilevel cervical spondylosis.   Original Report Authenticated By: Andreas Newport, M.D.      1. Vasovagal syncope   2. Gastroenteritis   3. Nasal laceration   4. Nasal fracture       MDM  I personally performed the services described in this documentation, which was scribed in my presence. The recorded information has been reviewed and is accurate.  Pt ambulating without difficulty. States he is feeling much better. Advised to f/u with Dr Jenne Pane in 5 days and return for worsening symptoms or concerns.    Loren Racer, MD 12/18/11 [phone]

## 2012-02-29 ENCOUNTER — Ambulatory Visit (INDEPENDENT_AMBULATORY_CARE_PROVIDER_SITE_OTHER): Payer: BC Managed Care – PPO | Admitting: General Surgery

## 2012-02-29 ENCOUNTER — Encounter (INDEPENDENT_AMBULATORY_CARE_PROVIDER_SITE_OTHER): Payer: Self-pay | Admitting: General Surgery

## 2012-02-29 VITALS — BP 120/82 | HR 90 | Temp 98.6°F | Resp 18 | Ht 68.0 in | Wt 207.0 lb

## 2012-02-29 DIAGNOSIS — K648 Other hemorrhoids: Secondary | ICD-10-CM

## 2012-02-29 DIAGNOSIS — K644 Residual hemorrhoidal skin tags: Secondary | ICD-10-CM

## 2012-02-29 NOTE — Progress Notes (Signed)
Patient ID: Johnathan [name], male   DOB: 22-Sep-1950, 62 y.o.   MRN: [id number]  Chief Complaint  Patient presents with  . Hemorrhoids    HPI Johnathan [name] is a 62 y.o. male.  Chief complaint: Hemorrhoids HPI I was asked to see this gentleman in consultation regarding his hemorrhoids by Dr. Nicholos Johns. The patient said that for many years. He occasionally has pain. He has not had a lot of bleeding. He denies constipation. He usually just feels a mass back there when he is cleaning himself after bowel movements.  Past Medical History  Diagnosis Date  . Hypertension     Past Surgical History  Procedure Laterality Date  . Cataract extraction      No family history on file.  Social History History  Substance Use Topics  . Smoking status: Never Smoker   . Smokeless tobacco: Never Used  . Alcohol Use: No    No Known Allergies  Current Outpatient Prescriptions  Medication Sig Dispense Refill  . Glucosamine-Chondroit-Vit C-Mn (GLUCOSAMINE 1500 COMPLEX PO) Take by mouth.      . hydrochlorothiazide (HYDRODIURIL) 25 MG tablet Take 25 mg by mouth daily.      Marland Kitchen lisinopril (PRINIVIL,ZESTRIL) 40 MG tablet Take 40 mg by mouth 2 (two) times daily.      . pantoprazole (PROTONIX) 40 MG tablet Take 40 mg by mouth daily.       . pravastatin (PRAVACHOL) 40 MG tablet Take 40 mg by mouth at bedtime.       No current facility-administered medications for this visit.    Review of Systems Review of Systems  Constitutional: Negative for fever, chills and unexpected weight change.  HENT: Negative for hearing loss, congestion, sore throat, trouble swallowing and voice change.   Eyes: Negative for visual disturbance.  Respiratory: Negative for cough and wheezing.   Cardiovascular: Negative for chest pain, palpitations and leg swelling.  Gastrointestinal: Positive for rectal pain. Negative for nausea, vomiting, abdominal pain, diarrhea, constipation, blood in stool, abdominal distention and  anal bleeding.       Please see history of present illness  Genitourinary: Negative for hematuria and difficulty urinating.  Musculoskeletal: Negative for arthralgias.  Skin: Negative for rash and wound.  Neurological: Negative for seizures, syncope, weakness and headaches.  Hematological: Negative for adenopathy. Does not bruise/bleed easily.  Psychiatric/Behavioral: Negative for confusion.    Blood pressure 120/82, pulse 90, temperature 98.6 F (37 C), temperature source Oral, resp. rate 18, height 5\' 8"  (1.727 m), weight 207 lb (93.895 kg).  Physical Exam Physical Exam  Constitutional: He appears well-developed and well-nourished. No distress.  HENT:  Head: Normocephalic and atraumatic.  Mouth/Throat: No oropharyngeal exudate.  Eyes: EOM are normal. Pupils are equal, round, and reactive to light. No scleral icterus.  Neck: Normal range of motion. No tracheal deviation present.  Cardiovascular: Normal rate, regular rhythm, normal heart sounds and intact distal pulses.   Pulmonary/Chest: Effort normal and breath sounds normal. No stridor. No respiratory distress. He has no wheezes. He has no rales.  Abdominal: Soft. Bowel sounds are normal. He exhibits no distension. There is no tenderness. There is no rebound and no guarding.  External anal exam reveals large circumferential anal skin tag, no active external hemorrhoidal inflammation, small external hemorrhoids present, and endoscopy was then done. 2 columns internal hemorrhoids are present. No bleeding. No other masses noted  Musculoskeletal: Normal range of motion.  Neurological: He is alert.  Skin: Skin is warm.    Data Reviewed  Office notes from Dr. Nicholos Johns  Assessment    Internal hemorrhoids x2 columns, external hemorrhoids without acute inflammation, circumferential large anal skin tags    Plan    I have offered internal hemorrhoidectomy and excision of external anal skin tags. Procedure, risks, and benefits were  discussed in detail with the patient. He is agreeable, however, he is unable to take 4 weeks off of work at this time. I feel that will be important in his recovery. 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 His wife recently had surgery as well. He is going to check on his finances & ability to take time off work. He has agreed to call in within the next couple months to discuss scheduling. In the interim, he agrees to call if symptoms worsen.       Farrah Skoda E 02/29/2012, 10:42 AM

## 2012-03-13 ENCOUNTER — Ambulatory Visit (INDEPENDENT_AMBULATORY_CARE_PROVIDER_SITE_OTHER): Payer: Self-pay | Admitting: General Surgery

## 2015-03-04 ENCOUNTER — Telehealth: Payer: Self-pay | Admitting: Family Medicine

## 2015-03-04 ENCOUNTER — Ambulatory Visit (INDEPENDENT_AMBULATORY_CARE_PROVIDER_SITE_OTHER): Payer: BLUE CROSS/BLUE SHIELD | Admitting: Family Medicine

## 2015-03-04 ENCOUNTER — Encounter: Payer: Self-pay | Admitting: Family Medicine

## 2015-03-04 VITALS — Temp 99.3°F | Ht 68.0 in | Wt 205.0 lb

## 2015-03-04 DIAGNOSIS — J029 Acute pharyngitis, unspecified: Secondary | ICD-10-CM | POA: Diagnosis not present

## 2015-03-04 DIAGNOSIS — R52 Pain, unspecified: Secondary | ICD-10-CM

## 2015-03-04 LAB — POCT INFLUENZA A/B
Influenza A, POC: NEGATIVE
Influenza B, POC: NEGATIVE

## 2015-03-04 MED ORDER — FLUTICASONE PROPIONATE 50 MCG/ACT NA SUSP: 1.0000 | Freq: Two times a day (BID) | NASAL | Status: DC | PRN

## 2015-03-04 MED ORDER — OSELTAMIVIR PHOSPHATE 75 MG PO CAPS: 75.0000 mg | ORAL_CAPSULE | Freq: Two times a day (BID) | ORAL | Status: DC

## 2015-03-04 NOTE — Progress Notes (Signed)
Temp(Src) 99.3 F (37.4 C) (Oral)  Ht 5\' 8"  (1.727 m)  Wt 205 lb (92.987 kg)  BMI 31.18 kg/m2   Subjective:    Patient ID: Johnathan [name], male    DOB: 08-Jan-1951, 65 y.o.   MRN: [id number]  HPI: KORDELL FRANCZAK is a 65 y.o. male presenting on 03/04/2015 for Generalized Body Aches; Weakness; and Cough   HPI Sore throat and congestion and body aches Patient comes in today with a 2 day history of fevers and chills and body aches and generalized weakness and cough that is productive of yellow-green sputum. He said his wife has been ill previous to him. He does not know of anybody that's been specifically diagnosed with the flu. He denies any shortness of breath or wheezing. His fever 2 days ago was more subjective because he didn't actually take her temperature.  Relevant past medical, surgical, family and social history reviewed and updated as indicated. Interim medical history since our last visit reviewed. Allergies and medications reviewed and updated.  Review of Systems  Constitutional: Positive for fever and chills.  HENT: Positive for congestion, postnasal drip, rhinorrhea, sinus pressure and sore throat. Negative for ear discharge, ear pain, sneezing and voice change.   Eyes: Negative for pain, discharge, redness and visual disturbance.  Respiratory: Positive for cough. Negative for shortness of breath and wheezing.   Cardiovascular: Negative for chest pain and leg swelling.  Gastrointestinal: Negative for abdominal pain, diarrhea and constipation.  Genitourinary: Negative for difficulty urinating.  Musculoskeletal: Positive for myalgias. Negative for back pain and gait problem.  Skin: Negative for rash.  Neurological: Negative for syncope, light-headedness and headaches.  All other systems reviewed and are negative.   Per HPI unless specifically indicated above  Social History   Social History  . Marital Status: Married    Spouse Name: N/A  . Number of Children:  N/A  . Years of Education: N/A   Occupational History  . Not on file.   Social History Main Topics  . Smoking status: Never Smoker   . Smokeless tobacco: Never Used  . Alcohol Use: No  . Drug Use: No  . Sexual Activity: Not on file   Other Topics Concern  . Not on file   Social History Narrative    Past Surgical History  Procedure Laterality Date  . Cataract extraction      History reviewed. No pertinent family history.    Medication List       This list is accurate as of: 03/04/15  2:45 PM.  Always use your most recent med list.               fluticasone 50 MCG/ACT nasal spray  Commonly known as:  FLONASE  Place 1 spray into both nostrils 2 (two) times daily as needed for allergies or rhinitis.     GLUCOSAMINE 1500 COMPLEX PO  Take by mouth.     hydrochlorothiazide 25 MG tablet  Commonly known as:  HYDRODIURIL  Take 25 mg by mouth daily.     lisinopril 40 MG tablet  Commonly known as:  PRINIVIL,ZESTRIL  Take 40 mg by mouth 2 (two) times daily.     oseltamivir 75 MG capsule  Commonly known as:  TAMIFLU  Take 1 capsule (75 mg total) by mouth 2 (two) times daily.     pantoprazole 40 MG tablet  Commonly known as:  PROTONIX  Take 40 mg by mouth daily.     pravastatin 40 MG tablet  Commonly known as:  PRAVACHOL  Take 40 mg by mouth at bedtime.           Objective:    Temp(Src) 99.3 F (37.4 C) (Oral)  Ht 5\' 8"  (1.727 m)  Wt 205 lb (92.987 kg)  BMI 31.18 kg/m2  Wt Readings from Last 3 Encounters:  03/04/15 205 lb (92.987 kg)  02/29/12 207 lb (93.895 kg)  07/09/11 207 lb (93.895 kg)    Physical Exam  Constitutional: He is oriented to person, place, and time. He appears well-developed and well-nourished. No distress.  HENT:  Right Ear: Tympanic membrane, external ear and ear canal normal.  Left Ear: Tympanic membrane, external ear and ear canal normal.  Nose: Mucosal edema and rhinorrhea present. No sinus tenderness. No epistaxis. Right sinus  exhibits no maxillary sinus tenderness and no frontal sinus tenderness. Left sinus exhibits no maxillary sinus tenderness and no frontal sinus tenderness.  Mouth/Throat: Uvula is midline and mucous membranes are normal. Posterior oropharyngeal edema and posterior oropharyngeal erythema present. No oropharyngeal exudate or tonsillar abscesses.  Eyes: Conjunctivae and EOM are normal. Pupils are equal, round, and reactive to light. Right eye exhibits no discharge. No scleral icterus.  Cardiovascular: Normal rate, regular rhythm, normal heart sounds and intact distal pulses.   No murmur heard. Pulmonary/Chest: Effort normal and breath sounds normal. No respiratory distress. He has no wheezes.  Abdominal: He exhibits no distension.  Musculoskeletal: Normal range of motion. He exhibits no edema.  Neurological: He is alert and oriented to person, place, and time. Coordination normal.  Skin: Skin is warm and dry. No rash noted. He is not diaphoretic.  Psychiatric: He has a normal mood and affect. His behavior is normal.  Vitals reviewed.   Results for orders placed or performed in visit on 03/04/15  POCT Influenza A/B  Result Value Ref Range   Influenza A, POC Negative Negative   Influenza B, POC Negative Negative      Assessment & Plan:   Problem List Items Addressed This Visit    None    Visit Diagnoses    Body aches    -  Primary    Relevant Medications    oseltamivir (TAMIFLU) 75 MG capsule    fluticasone (FLONASE) 50 MCG/ACT nasal spray    Other Relevant Orders    POCT Influenza A/B (Completed)    Acute pharyngitis, unspecified etiology        Sounds like it could be the flu, will treat with Tamiflu and Flonase and Mucinex.    Relevant Medications    oseltamivir (TAMIFLU) 75 MG capsule    fluticasone (FLONASE) 50 MCG/ACT nasal spray        Follow up plan: Return in about 3 months (around 06/01/2015), or if symptoms worsen or fail to improve, for Hypertension and diabetes and  establish care.Caryl Pina, MD [facility] 03/04/2015, 2:45 PM

## 2015-03-04 NOTE — Telephone Encounter (Signed)
Both tamiflu and flonase sent to [facility] and pt is aware.

## 2015-03-16 ENCOUNTER — Telehealth: Payer: Self-pay | Admitting: Family Medicine

## 2015-03-16 MED ORDER — HYDROCODONE-HOMATROPINE 5-1.5 MG/5ML PO SYRP: 5.0000 mL | ORAL_SOLUTION | Freq: Three times a day (TID) | ORAL | Status: DC | PRN

## 2015-03-16 MED ORDER — BENZONATATE 200 MG PO CAPS: 200.0000 mg | ORAL_CAPSULE | Freq: Three times a day (TID) | ORAL | Status: DC | PRN

## 2015-03-16 NOTE — Telephone Encounter (Signed)
Tessalon Prescription sent to pharmacy  and cough syrup rx ready for pick up

## 2015-03-16 NOTE — Telephone Encounter (Signed)
Pt aware of all!! 

## 2015-03-16 NOTE — Telephone Encounter (Signed)
Any suggestions  - or NTBS again?

## 2015-10-28 ENCOUNTER — Ambulatory Visit: Payer: Self-pay | Admitting: General Surgery

## 2015-12-22 ENCOUNTER — Encounter (HOSPITAL_BASED_OUTPATIENT_CLINIC_OR_DEPARTMENT_OTHER): Payer: Self-pay | Admitting: *Deleted

## 2015-12-22 NOTE — Progress Notes (Signed)
   12/22/15 1538  OBSTRUCTIVE SLEEP APNEA  Have you ever been diagnosed with sleep apnea through a sleep study? No  Do you snore loudly (loud enough to be heard through closed doors)?  1  Do you often feel tired, fatigued, or sleepy during the daytime (such as falling asleep during driving or talking to someone)? 0  Has anyone observed you stop breathing during your sleep? 0  Do you have, or are you being treated for high blood pressure? 1  BMI more than 35 kg/m2? 0  Age > 50 (1-yes) 1  Neck circumference greater than:Male 16 inches or larger, Male 17inches or larger? 0  Male Gender (Yes=1) 1  Obstructive Sleep Apnea Score 4

## 2015-12-23 ENCOUNTER — Encounter (HOSPITAL_BASED_OUTPATIENT_CLINIC_OR_DEPARTMENT_OTHER)
Admission: RE | Admit: 2015-12-23 | Discharge: 2015-12-23 | Disposition: A | Discharge status: Home / Self Care | Payer: BLUE CROSS/BLUE SHIELD | Source: Ambulatory Visit | Attending: General Surgery | Admitting: General Surgery

## 2015-12-23 DIAGNOSIS — Z01818 Encounter for other preprocedural examination: Secondary | ICD-10-CM | POA: Insufficient documentation

## 2015-12-28 ENCOUNTER — Encounter (HOSPITAL_BASED_OUTPATIENT_CLINIC_OR_DEPARTMENT_OTHER)
Admission: RE | Disposition: A | Discharge status: Home / Self Care | Payer: Self-pay | Source: Ambulatory Visit | Attending: General Surgery

## 2015-12-28 ENCOUNTER — Ambulatory Visit (HOSPITAL_BASED_OUTPATIENT_CLINIC_OR_DEPARTMENT_OTHER): Payer: BLUE CROSS/BLUE SHIELD | Admitting: Anesthesiology

## 2015-12-28 ENCOUNTER — Encounter (HOSPITAL_BASED_OUTPATIENT_CLINIC_OR_DEPARTMENT_OTHER): Payer: Self-pay | Admitting: *Deleted

## 2015-12-28 ENCOUNTER — Ambulatory Visit (HOSPITAL_BASED_OUTPATIENT_CLINIC_OR_DEPARTMENT_OTHER)
Admission: RE | Admit: 2015-12-28 | Discharge: 2015-12-28 | Disposition: A | Discharge status: Home / Self Care | Payer: BLUE CROSS/BLUE SHIELD | Source: Ambulatory Visit | Attending: General Surgery | Admitting: General Surgery

## 2015-12-28 DIAGNOSIS — I10 Essential (primary) hypertension: Secondary | ICD-10-CM | POA: Diagnosis not present

## 2015-12-28 DIAGNOSIS — K644 Residual hemorrhoidal skin tags: Secondary | ICD-10-CM | POA: Insufficient documentation

## 2015-12-28 DIAGNOSIS — L918 Other hypertrophic disorders of the skin: Secondary | ICD-10-CM | POA: Diagnosis not present

## 2015-12-28 DIAGNOSIS — Z6830 Body mass index (BMI) 30.0-30.9, adult: Secondary | ICD-10-CM | POA: Diagnosis not present

## 2015-12-28 DIAGNOSIS — Z79899 Other long term (current) drug therapy: Secondary | ICD-10-CM | POA: Diagnosis not present

## 2015-12-28 DIAGNOSIS — E669 Obesity, unspecified: Secondary | ICD-10-CM | POA: Insufficient documentation

## 2015-12-28 DIAGNOSIS — E785 Hyperlipidemia, unspecified: Secondary | ICD-10-CM | POA: Diagnosis not present

## 2015-12-28 DIAGNOSIS — K219 Gastro-esophageal reflux disease without esophagitis: Secondary | ICD-10-CM | POA: Insufficient documentation

## 2015-12-28 HISTORY — DX: Hyperlipidemia, unspecified: E78.5

## 2015-12-28 HISTORY — DX: Gastro-esophageal reflux disease without esophagitis: K21.9

## 2015-12-28 HISTORY — PX: EXCISION OF SKIN TAG: SHX6270

## 2015-12-28 HISTORY — PX: HEMORRHOID SURGERY: SHX153

## 2015-12-28 SURGERY — EXCISION, SKIN TAG
Anesthesia: General

## 2015-12-28 MED ORDER — FENTANYL CITRATE (PF) 100 MCG/2ML IJ SOLN: 50.0000 ug | INTRAMUSCULAR | Status: DC | PRN

## 2015-12-28 MED ORDER — CEFAZOLIN SODIUM-DEXTROSE 2-4 GM/100ML-% IV SOLN
2.0000 g | INTRAVENOUS | Status: AC
  Administered 2015-12-28: 2 g via INTRAVENOUS

## 2015-12-28 MED ORDER — PROPOFOL 10 MG/ML IV BOLUS
INTRAVENOUS | Status: AC
  Filled 2015-12-28: qty 20

## 2015-12-28 MED ORDER — MIDAZOLAM HCL 2 MG/2ML IJ SOLN
INTRAMUSCULAR | Status: AC
  Filled 2015-12-28: qty 2

## 2015-12-28 MED ORDER — CEFAZOLIN SODIUM-DEXTROSE 2-4 GM/100ML-% IV SOLN
INTRAVENOUS | Status: AC
  Filled 2015-12-28: qty 100

## 2015-12-28 MED ORDER — LIDOCAINE 2% (20 MG/ML) 5 ML SYRINGE
INTRAMUSCULAR | Status: AC
  Filled 2015-12-28: qty 5

## 2015-12-28 MED ORDER — ONDANSETRON HCL 4 MG/2ML IJ SOLN
INTRAMUSCULAR | Status: AC
  Filled 2015-12-28: qty 2

## 2015-12-28 MED ORDER — LIDOCAINE HCL (CARDIAC) 20 MG/ML IV SOLN
INTRAVENOUS | Status: DC | PRN
  Administered 2015-12-28: 50 mg via INTRAVENOUS

## 2015-12-28 MED ORDER — FENTANYL CITRATE (PF) 100 MCG/2ML IJ SOLN: 25.0000 ug | INTRAMUSCULAR | Status: DC | PRN

## 2015-12-28 MED ORDER — EPHEDRINE SULFATE 50 MG/ML IJ SOLN
INTRAMUSCULAR | Status: DC | PRN
  Administered 2015-12-28: 10 mg via INTRAVENOUS

## 2015-12-28 MED ORDER — ONDANSETRON HCL 4 MG/2ML IJ SOLN: 4.0000 mg | Freq: Once | INTRAMUSCULAR | Status: DC | PRN

## 2015-12-28 MED ORDER — GLYCOPYRROLATE 0.2 MG/ML IJ SOLN: 0.2000 mg | Freq: Once | INTRAMUSCULAR | Status: DC | PRN

## 2015-12-28 MED ORDER — OXYCODONE HCL 5 MG PO TABS: 5.0000 mg | ORAL_TABLET | ORAL | 0 refills | Status: DC | PRN

## 2015-12-28 MED ORDER — SCOPOLAMINE 1 MG/3DAYS TD PT72: 1.0000 | MEDICATED_PATCH | Freq: Once | TRANSDERMAL | Status: DC | PRN

## 2015-12-28 MED ORDER — PROPOFOL 10 MG/ML IV BOLUS
INTRAVENOUS | Status: DC | PRN
  Administered 2015-12-28: 150 mg via INTRAVENOUS

## 2015-12-28 MED ORDER — DEXAMETHASONE SODIUM PHOSPHATE 4 MG/ML IJ SOLN
INTRAMUSCULAR | Status: DC | PRN
Start: 2015-12-28 — End: 2015-12-28
  Administered 2015-12-28: 10 mg via INTRAVENOUS

## 2015-12-28 MED ORDER — FENTANYL CITRATE (PF) 100 MCG/2ML IJ SOLN
INTRAMUSCULAR | Status: DC | PRN
  Administered 2015-12-28 (×2): 50 ug via INTRAVENOUS

## 2015-12-28 MED ORDER — CHLORHEXIDINE GLUCONATE CLOTH 2 % EX PADS: 6.0000 | MEDICATED_PAD | Freq: Once | CUTANEOUS | Status: DC

## 2015-12-28 MED ORDER — PHENYLEPHRINE 40 MCG/ML (10ML) SYRINGE FOR IV PUSH (FOR BLOOD PRESSURE SUPPORT)
PREFILLED_SYRINGE | INTRAVENOUS | Status: AC
  Filled 2015-12-28: qty 10

## 2015-12-28 MED ORDER — ONDANSETRON HCL 4 MG/2ML IJ SOLN
INTRAMUSCULAR | Status: DC | PRN
  Administered 2015-12-28: 4 mg via INTRAVENOUS

## 2015-12-28 MED ORDER — FENTANYL CITRATE (PF) 100 MCG/2ML IJ SOLN
INTRAMUSCULAR | Status: AC
  Filled 2015-12-28: qty 2

## 2015-12-28 MED ORDER — THROMBIN 5000 UNITS EX SOLR
CUTANEOUS | Status: DC | PRN
  Administered 2015-12-28: 5000 [IU] via TOPICAL

## 2015-12-28 MED ORDER — MIDAZOLAM HCL 2 MG/2ML IJ SOLN
INTRAMUSCULAR | Status: DC | PRN
  Administered 2015-12-28: 2 mg via INTRAVENOUS

## 2015-12-28 MED ORDER — DEXAMETHASONE SODIUM PHOSPHATE 10 MG/ML IJ SOLN
INTRAMUSCULAR | Status: AC
  Filled 2015-12-28: qty 1

## 2015-12-28 MED ORDER — EPHEDRINE 5 MG/ML INJ
INTRAVENOUS | Status: AC
  Filled 2015-12-28: qty 10

## 2015-12-28 MED ORDER — LACTATED RINGERS IV SOLN: 500.0000 mL | INTRAVENOUS | Status: DC

## 2015-12-28 MED ORDER — LACTATED RINGERS IV SOLN
INTRAVENOUS | Status: DC
  Administered 2015-12-28: 08:00:00 via INTRAVENOUS

## 2015-12-28 MED ORDER — MIDAZOLAM HCL 2 MG/2ML IJ SOLN: 1.0000 mg | INTRAMUSCULAR | Status: DC | PRN

## 2015-12-28 MED ORDER — BUPIVACAINE LIPOSOME 1.3 % IJ SUSP
INTRAMUSCULAR | Status: DC | PRN
  Administered 2015-12-28: 20 mL

## 2015-12-28 MED ORDER — LIDOCAINE 5 % EX OINT: 1.0000 "application " | TOPICAL_OINTMENT | CUTANEOUS | 0 refills | Status: DC | PRN

## 2015-12-28 MED ORDER — PHENYLEPHRINE HCL 10 MG/ML IJ SOLN
INTRAMUSCULAR | Status: DC | PRN
  Administered 2015-12-28: 40 ug via INTRAVENOUS

## 2015-12-28 SURGICAL SUPPLY — 40 items
BLADE SURG 11 STRL SS (BLADE) ×4 IMPLANT
BLADE SURG 15 STRL LF DISP TIS (BLADE) IMPLANT
BLADE SURG 15 STRL SS (BLADE)
CANISTER SUCT 1200ML W/VALVE (MISCELLANEOUS) ×4 IMPLANT
CLEANER CAUTERY TIP 5X5 PAD (MISCELLANEOUS) IMPLANT
DECANTER SPIKE VIAL GLASS SM (MISCELLANEOUS) ×1 IMPLANT
DRAPE UTILITY XL STRL (DRAPES) ×4 IMPLANT
DRSG EMULSION OIL 3X3 NADH (GAUZE/BANDAGES/DRESSINGS) IMPLANT
DRSG PAD ABDOMINAL 8X10 ST (GAUZE/BANDAGES/DRESSINGS) ×4 IMPLANT
ELECT REM PT RETURN 9FT ADLT (ELECTROSURGICAL) ×4
ELECTRODE REM PT RTRN 9FT ADLT (ELECTROSURGICAL) ×1 IMPLANT
GAUZE PETROLATUM 1 X8 (GAUZE/BANDAGES/DRESSINGS) ×4 IMPLANT
GLOVE BIO SURGEON STRL SZ8 (GLOVE) ×4 IMPLANT
GLOVE BIOGEL PI IND STRL 8 (GLOVE) ×2 IMPLANT
GLOVE BIOGEL PI INDICATOR 8 (GLOVE) ×2
GOWN STRL REUS W/ TWL LRG LVL3 (GOWN DISPOSABLE) ×2 IMPLANT
GOWN STRL REUS W/ TWL XL LVL3 (GOWN DISPOSABLE) ×2 IMPLANT
GOWN STRL REUS W/TWL LRG LVL3 (GOWN DISPOSABLE) ×4
GOWN STRL REUS W/TWL XL LVL3 (GOWN DISPOSABLE) ×4
NEEDLE HYPO 22GX1.5 SAFETY (NEEDLE) ×4 IMPLANT
PACK BASIN DAY SURGERY FS (CUSTOM PROCEDURE TRAY) ×4 IMPLANT
PACK LITHOTOMY IV (CUSTOM PROCEDURE TRAY) ×4 IMPLANT
PAD CLEANER CAUTERY TIP 5X5 (MISCELLANEOUS)
PENCIL BUTTON HOLSTER BLD 10FT (ELECTRODE) IMPLANT
SHEARS HARMONIC 9CM CVD (BLADE) ×3 IMPLANT
SPONGE GAUZE 4X4 12PLY STER LF (GAUZE/BANDAGES/DRESSINGS) ×8 IMPLANT
SPONGE SURGIFOAM ABS GEL 100 (HEMOSTASIS) IMPLANT
SURGILUBE 2OZ TUBE FLIPTOP (MISCELLANEOUS) ×16 IMPLANT
SUT CHROMIC 3 0 SH 27 (SUTURE) IMPLANT
SUT VIC AB 3-0 SH 27 (SUTURE) ×4
SUT VIC AB 3-0 SH 27X BRD (SUTURE) IMPLANT
SYR CONTROL 10ML LL (SYRINGE) ×4 IMPLANT
TOWEL OR 17X24 6PK STRL BLUE (TOWEL DISPOSABLE) ×8 IMPLANT
TOWEL OR NON WOVEN STRL DISP B (DISPOSABLE) ×4 IMPLANT
TRAY DSU PREP LF (CUSTOM PROCEDURE TRAY) ×4 IMPLANT
TRAY PROCTOSCOPIC FIBER OPTIC (SET/KITS/TRAYS/PACK) IMPLANT
TUBE CONNECTING 20'X1/4 (TUBING) ×1
TUBE CONNECTING 20X1/4 (TUBING) ×3 IMPLANT
UNDERPAD 30X30 (UNDERPADS AND DIAPERS) ×4 IMPLANT
YANKAUER SUCT BULB TIP NO VENT (SUCTIONS) ×4 IMPLANT

## 2015-12-28 NOTE — Anesthesia Preprocedure Evaluation (Addendum)
Anesthesia Evaluation  Patient identified by MRN, date of birth, ID band Patient awake    Reviewed: Allergy & Precautions, NPO status , Patient's Chart, lab work & pertinent test results  Airway Mallampati: III  TM Distance: >3 FB Neck ROM: Full    Dental  (+) Teeth Intact, Dental Advisory Given, Missing   Pulmonary neg pulmonary ROS,    Pulmonary exam normal breath sounds clear to auscultation       Cardiovascular hypertension, Pt. on medications Normal cardiovascular exam Rhythm:Regular Rate:Normal  HLD   Neuro/Psych negative neurological ROS  negative psych ROS   GI/Hepatic Neg liver ROS, GERD  Medicated and Controlled,  Endo/Other  Obesity   Renal/GU negative Renal ROS     Musculoskeletal negative musculoskeletal ROS (+)   Abdominal   Peds  Hematology negative hematology ROS (+)   Anesthesia Other Findings Day of surgery medications reviewed with the patient.  Reproductive/Obstetrics                            Anesthesia Physical Anesthesia Plan  ASA: II  Anesthesia Plan: General   Post-op Pain Management:    Induction: Intravenous  Airway Management Planned: LMA  Additional Equipment:   Intra-op Plan:   Post-operative Plan: Extubation in OR  Informed Consent: I have reviewed the patients History and Physical, chart, labs and discussed the procedure including the risks, benefits and alternatives for the proposed anesthesia with the patient or authorized representative who has indicated his/her understanding and acceptance.   Dental advisory given  Plan Discussed with: CRNA  Anesthesia Plan Comments: (Risks/benefits of general anesthesia discussed with patient including risk of damage to teeth, lips, gum, and tongue, nausea/vomiting, allergic reactions to medications, and the possibility of heart attack, stroke and death.  All patient questions answered.  Patient wishes  to proceed.)       Anesthesia Quick Evaluation

## 2015-12-28 NOTE — Interval H&P Note (Signed)
History and Physical Interval Note:  12/28/2015 8:55 AM  Johnathan [name]  has presented today for surgery, with the diagnosis of anal and left upper thigh skin tags  The various methods of treatment have been discussed with the patient and family. After consideration of risks, benefits and other options for treatment, the patient has consented to  Procedure(s): EXCISION OF ANAL  SKIN TAGS AND 3 SKIN TAGS LEFT THIGH (N/A) as a surgical intervention .  The patient's history has been reviewed, patient examined, no change in status, stable for surgery.  I have reviewed the patient's chart and labs.  Questions were answered to the patient's satisfaction.     Aleksey Newbern E

## 2015-12-28 NOTE — Anesthesia Postprocedure Evaluation (Signed)
Anesthesia Post Note  Patient: Johnathan [name]  Procedure(s) Performed: Procedure(s) (LRB): EXCISION OF ANAL  SKIN TAGS AND 3 SKIN TAGS LEFT THIGH (N/A) HEMORRHOIDECTOMY x2  Patient location during evaluation: PACU Anesthesia Type: General Level of consciousness: awake and alert Pain management: pain level controlled Vital Signs Assessment: post-procedure vital signs reviewed and stable Respiratory status: spontaneous breathing, nonlabored ventilation, respiratory function stable and patient connected to nasal cannula oxygen Cardiovascular status: blood pressure returned to baseline and stable Postop Assessment: no signs of nausea or vomiting Anesthetic complications: no    Last Vitals:  Vitals:   12/28/15 1045 12/28/15 1100  BP:  138/88  Pulse: 85 88  Resp: (!) 9 18  Temp:  36.5 C    Last Pain:  Vitals:   12/28/15 0814  TempSrc: Oral                 Catalina Gravel

## 2015-12-28 NOTE — H&P (Signed)
  Johnathan [name] 10/28/2015 11:33 AM Location: [facility] Patient #: [id number] DOB: 04-01-1950 Married / Language: Johnathan [name] / Race: White Male   History of Present Illness Johnathan Neri E. Grandville Silos MD; 10/28/2015 11:51 AM) The patient is a 65 year old male who presents with hemorrhoids. He underwent banding of internal hemorrhoid a couple weeks ago. He is doing well from that. No further itching or pain. He has not had any bleeding. He does continue to complain of the circumferential anal skin tags and would like to consider having this removed. Of note, he also mentions 3 small regular skin tags up on his inner left thigh which he would also like removed.   Allergies (Sonya Bynum, CMA; 10/28/2015 11:34 AM) No Known Drug Allergies 10/07/2015  Medication History (Sonya Bynum, CMA; 10/28/2015 11:34 AM) Glucosamine 1500 Complex (Oral) Active. HydroDiuril (25MG  Tablet, Oral) Active. Lisinopril (40MG  Tablet, Oral) Active. Protonix (40MG  Packet, Oral) Active. Pravastatin Sodium (40MG  Tablet, Oral) Active. Medications Reconciled  Vitals (Sonya Bynum CMA; 10/28/2015 11:34 AM) 10/28/2015 11:33 AM Weight: 200 lb Height: 68in Body Surface Area: 2.04 m Body Mass Index: 30.41 kg/m  Temp.: 37F(Temporal)  Pulse: 77 (Regular)  BP: 128/80 (Sitting, Left Arm, Standard)       Physical Exam Johnathan Neri E. Grandville Silos MD; 10/28/2015 11:53 AM) General Note: no distress   Integumentary Note: 3 small skin tags of upper inner left thigh, no abnormal pigmentation   Head and Neck Note: Normocephalic, neck is supple   Eye Note: Wears glasses, PERRLA   Chest and Lung Exam Note: Clear to auscultation bilaterally   Cardiovascular Note: Regular rate and rhythm   Abdomen Note: Soft, nontender   Male Genitourinary Note: Normal external genitalia   Rectal Note: Circumferential anal skin tags, digital rectal exam reveals resolution of internal  hemorrhoid previously palpable   Musculoskeletal Note: No deformity     Assessment & Plan Johnathan Neri E. Grandville Silos MD; 10/28/2015 11:54 AM) SKIN TAGS, ANUS OR RECTUM (K64.4) Impression: He would like to have these removed. I have offered to do this as an elective outpatient procedure. Procedure, risks, and benefits were discussed with him and I discussed the expected postoperative course. INTERNAL HEMORRHOIDS (K64.8) Impression: Improved status post banding SKIN TAG (L91.8) Impression: 3 skin tags of left upper inner thigh. We'll plan to remove at the same time as his anal skin tag surgery.  Update 12/28/15  No change in exam of anal skin tags or skin tags L thigh. I marked his site.  Georganna Skeans, MD, MPH, FACS Trauma: [phone] General Surgery: [phone]

## 2015-12-28 NOTE — Op Note (Signed)
12/28/2015  10:04 AM  PATIENT:  Johnathan [name]  65 y.o. male  PRE-OPERATIVE DIAGNOSIS:  anal and left upper thigh skin tags  POST-OPERATIVE DIAGNOSIS:  External hemorrhoid x2, anal and left upper thigh skin tags  PROCEDURE:  Procedure(s): EXCISION 4 SKIN TAGS LEFT THIGH EXCISION OF ANAL  SKIN TAGS  EXTERNAL HEMORRHOIDECTOMY x2  SURGEON:  Surgeon(s): Georganna Skeans, MD  ASSISTANTS: none   ANESTHESIA:   local and general  EBL:  Total I/O In: -  Out: 15 [Blood:15]  BLOOD ADMINISTERED:none  DRAINS: none   SPECIMEN:  Excision  DISPOSITION OF SPECIMEN:  PATHOLOGY  COUNTS:  YES  DICTATION: .Dragon Dictation Findings: External hemorrhoid 2, 4 left upper thigh skin tags  Procedure in detail: Azar presents for excision of left upper thigh skin tags and excision of anal skin tags. Informed consent was obtained. His site was marked. He received intravenous antibiotics. He was brought to the operating room and general anesthesia was administered with laryngeal mask airway. He was placed in lithotomy position. His upper thighs and anal area were prepped and draped in a sterile fashion. Timeout procedure was performed. Exparel was injected at the thigh locations as well as perianal region for post-op pain relief.Attention was directed first to the left upper thigh skin tags. There were actually a total of 4. These were each excised sharply and the base was cauterized. Once removed, Dermabond was placed on each side. Attention was then directed to the anal skin tags. Examination under anesthesia revealed directly to external hemorrhoids at the 10:00 and 7:00 position. Incision was made in the skin and these were excised using the harmonic scalpel. We stayed away from the sphincters. Both were removed completely, dissecting the vein back and vomiting with the harmonic scalpel. Next, the external skin tags from the right side were excised sharply and the harmonic was used to get good  hemostasis. Examination revealed no bleeding. The surrounding anoderm was closed in a few areas with interrupted 3-0 Vicryl sutures. The remainder small areas were left open. Further examination revealed excellent hemostasis and an endorectal dressing applied thrombin-soaked Gelfoam roll was placed. Sterile dressings were applied. All counts were correct. He tolerated the procedure without apparent complications taken recovery in stable condition. PATIENT DISPOSITION:  PACU - hemodynamically stable.   Delay start of Pharmacological VTE agent (>24hrs) due to surgical blood loss or risk of bleeding:  no  Georganna Skeans, MD, MPH, FACS Pager: [phone]  12/18/201710:04 AM

## 2015-12-28 NOTE — Anesthesia Procedure Notes (Signed)
Procedure Name: LMA Insertion Date/Time: 12/28/2015 9:24 AM Performed by: Rayvon Char Pre-anesthesia Checklist: Patient identified, Suction available, Patient being monitored and Emergency Drugs available Patient Re-evaluated:Patient Re-evaluated prior to inductionOxygen Delivery Method: Circle system utilized Preoxygenation: Pre-oxygenation with 100% oxygen Intubation Type: IV induction Ventilation: Mask ventilation without difficulty LMA: LMA inserted LMA Size: 4.0 Placement Confirmation: CO2 detector and breath sounds checked- equal and bilateral Dental Injury: Teeth and Oropharynx as per pre-operative assessment

## 2015-12-28 NOTE — Transfer of Care (Signed)
Immediate Anesthesia Transfer of Care Note  Patient: Johnathan [name]  Procedure(s) Performed: Procedure(s): EXCISION OF ANAL  SKIN TAGS AND 3 SKIN TAGS LEFT THIGH (N/A) HEMORRHOIDECTOMY x2  Patient Location: PACU  Anesthesia Type:General  Level of Consciousness: awake, alert  and oriented  Airway & Oxygen Therapy: Patient Spontanous Breathing and Patient connected to face mask oxygen  Post-op Assessment: Report given to RN and Post -op Vital signs reviewed and stable  Post vital signs: Reviewed and stable  Last Vitals:  Vitals:   12/28/15 1030 12/28/15 1038  BP: (!) 126/92   Pulse: 89 88  Resp: (!) 27 15  Temp:      Last Pain:  Vitals:   12/28/15 0814  TempSrc: Oral         Complications: No apparent anesthesia complications

## 2015-12-28 NOTE — Discharge Instructions (Signed)

## 2015-12-29 ENCOUNTER — Encounter (HOSPITAL_BASED_OUTPATIENT_CLINIC_OR_DEPARTMENT_OTHER): Payer: Self-pay | Admitting: General Surgery

## 2016-06-27 ENCOUNTER — Ambulatory Visit (INDEPENDENT_AMBULATORY_CARE_PROVIDER_SITE_OTHER): Payer: Medicare Other | Admitting: Family Medicine

## 2016-06-27 ENCOUNTER — Encounter: Payer: Self-pay | Admitting: Family Medicine

## 2016-06-27 VITALS — BP 135/84 | HR 79 | Temp 97.5°F | Ht 68.0 in | Wt 204.0 lb

## 2016-06-27 DIAGNOSIS — E669 Obesity, unspecified: Secondary | ICD-10-CM | POA: Diagnosis not present

## 2016-06-27 DIAGNOSIS — K219 Gastro-esophageal reflux disease without esophagitis: Secondary | ICD-10-CM

## 2016-06-27 DIAGNOSIS — E785 Hyperlipidemia, unspecified: Secondary | ICD-10-CM

## 2016-06-27 DIAGNOSIS — Z6831 Body mass index (BMI) 31.0-31.9, adult: Secondary | ICD-10-CM | POA: Diagnosis not present

## 2016-06-27 DIAGNOSIS — Z1159 Encounter for screening for other viral diseases: Secondary | ICD-10-CM | POA: Diagnosis not present

## 2016-06-27 DIAGNOSIS — I10 Essential (primary) hypertension: Secondary | ICD-10-CM | POA: Diagnosis not present

## 2016-06-27 MED ORDER — HYDROCHLOROTHIAZIDE 25 MG PO TABS: 25.0000 mg | ORAL_TABLET | Freq: Every day | ORAL | 2 refills | Status: DC

## 2016-06-27 MED ORDER — PRAVASTATIN SODIUM 40 MG PO TABS: 40.0000 mg | ORAL_TABLET | Freq: Every day | ORAL | 2 refills | Status: DC

## 2016-06-27 MED ORDER — ATORVASTATIN CALCIUM 40 MG PO TABS: 40.0000 mg | ORAL_TABLET | Freq: Every day | ORAL | 2 refills | Status: DC

## 2016-06-27 MED ORDER — LISINOPRIL 40 MG PO TABS: 40.0000 mg | ORAL_TABLET | Freq: Every day | ORAL | 2 refills | Status: DC

## 2016-06-27 MED ORDER — OMEPRAZOLE 40 MG PO CPDR: 40.0000 mg | DELAYED_RELEASE_CAPSULE | Freq: Every day | ORAL | 2 refills | Status: DC

## 2016-06-27 NOTE — Progress Notes (Signed)
BP 135/84   Pulse 79   Temp 97.5 F (36.4 C) (Oral)   Ht _0  (1.727 m)   Wt 204 lb (92.5 kg)   BMI 31.02 kg/m    Subjective:    Patient ID: Johnathan [name], male    DOB: Sep 27, 1950, 66 y.o.   MRN: [id number]  HPI: Johnathan [name] is a 66 y.o. male presenting on 06/27/2016 for Annual Exam (patient is fasting)   HPI Hyperlipidemia Patient is coming in for recheck of his hyperlipidemia. He is currently taking Pravastatin but says he is insurance does not want to pay for it and wants to be switched to something that'll pay for. He denies any issues with myalgias or history of liver damage from it. He denies any focal numbness or weakness or chest pain.   Hypertension Patient is currently on lisinopril, hydrochlorothiazide, and her blood pressure today is 135/84. Patient denies any lightheadedness or dizziness. Patient denies headaches, blurred vision, chest pains, shortness of breath, or weakness. Denies any side effects from medication and is content with current medication.   GERD recheck Patient has been on omeprazole 40 and says that he is doing very well and denies any major issues with it. He denies any reflux or blood in his stool. He denies any abdominal pain.  Relevant past medical, surgical, family and social history reviewed and updated as indicated. Interim medical history since our last visit reviewed. Allergies and medications reviewed and updated.  Review of Systems  Constitutional: Negative for chills and fever.  HENT: Negative for ear pain, rhinorrhea, sinus pain and sinus pressure.   Eyes: Negative for discharge.  Respiratory: Negative for cough, shortness of breath and wheezing.   Cardiovascular: Negative for chest pain and leg swelling.  Gastrointestinal: Negative for abdominal pain, constipation, diarrhea, nausea and vomiting.  Musculoskeletal: Negative for back pain and gait problem.  Skin: Negative for rash.  Neurological: Negative for dizziness,  weakness, light-headedness and numbness.  All other systems reviewed and are negative.   Per HPI unless specifically indicated above   Allergies as of 06/27/2016   No Known Allergies     Medication List       Accurate as of 06/27/16 10:51 AM. Always use your most recent med list.          GLUCOSAMINE 1500 COMPLEX PO Take by mouth.   hydrochlorothiazide 25 MG tablet Commonly known as:  HYDRODIURIL Take 1 tablet (25 mg total) by mouth daily.   lisinopril 40 MG tablet Commonly known as:  PRINIVIL,ZESTRIL Take 1 tablet (40 mg total) by mouth daily.   omeprazole 40 MG capsule Commonly known as:  PRILOSEC Take 1 capsule (40 mg total) by mouth daily.   oxyCODONE 5 MG immediate release tablet Commonly known as:  Oxy IR/ROXICODONE Take 1-2 tablets (5-10 mg total) by mouth every 4 (four) hours as needed for moderate pain or severe pain.   pravastatin 40 MG tablet Commonly known as:  PRAVACHOL Take 1 tablet (40 mg total) by mouth at bedtime.          Objective:    BP 135/84   Pulse 79   Temp 97.5 F (36.4 C) (Oral)   Ht _1  (1.727 m)   Wt 204 lb (92.5 kg)   BMI 31.02 kg/m   Wt Readings from Last 3 Encounters:  06/27/16 204 lb (92.5 kg)  12/28/15 206 lb (93.4 kg)  03/04/15 205 lb (93 kg)    Physical Exam  Constitutional: He  is oriented to person, place, and time. He appears well-developed and well-nourished. No distress.  HENT:  Right Ear: External ear normal.  Left Ear: External ear normal.  Nose: Nose normal.  Mouth/Throat: Oropharynx is clear and moist. No oropharyngeal exudate.  Eyes: Conjunctivae and EOM are normal. Pupils are equal, round, and reactive to light. Right eye exhibits no discharge. No scleral icterus.  Neck: Neck supple. No thyromegaly present.  Cardiovascular: Normal rate, regular rhythm, normal heart sounds and intact distal pulses.   No murmur heard. Pulmonary/Chest: Effort normal and breath sounds normal. No respiratory distress. He  has no wheezes.  Abdominal: Soft. Bowel sounds are normal. He exhibits no distension. There is no tenderness. There is no rebound and no guarding.  Genitourinary:  Genitourinary Comments: Patient says he is not having issues and declines exam today  Musculoskeletal: Normal range of motion. He exhibits no edema.  Lymphadenopathy:    He has no cervical adenopathy.  Neurological: He is alert and oriented to person, place, and time. Coordination normal.  Skin: Skin is warm and dry. No rash noted. He is not diaphoretic.  Psychiatric: He has a normal mood and affect. His behavior is normal.  Nursing note and vitals reviewed.       Assessment & Plan:   Problem List Items Addressed This Visit      Cardiovascular and Mediastinum   Hypertension - Primary   Relevant Medications   lisinopril (PRINIVIL,ZESTRIL) 40 MG tablet   hydrochlorothiazide (HYDRODIURIL) 25 MG tablet   atorvastatin (LIPITOR) 40 MG tablet   Other Relevant Orders   CMP14+EGFR     Digestive   GERD (gastroesophageal reflux disease)   Relevant Medications   omeprazole (PRILOSEC) 40 MG capsule   Other Relevant Orders   CBC with Differential/Platelet     Other   Obesity   Hyperlipidemia LDL goal <130   Relevant Medications   lisinopril (PRINIVIL,ZESTRIL) 40 MG tablet   hydrochlorothiazide (HYDRODIURIL) 25 MG tablet   atorvastatin (LIPITOR) 40 MG tablet   Other Relevant Orders   Lipid panel    Other Visit Diagnoses    Need for hepatitis C screening test       Relevant Orders   Hepatitis C antibody     Discussed obesity and weight loss and overall health changes, patient seems to be doing well or at least stable at this point. He says that he had a colonoscopy 2 years ago and they recommended one for next year, we'll request these records   Follow up plan: Return in about 6 months (around 12/27/2016), or if symptoms worsen or fail to improve, for Recheck cholesterol and hypertension.  Counseling provided for  all of the vaccine components Orders Placed This Encounter  Procedures  . CBC with Differential/Platelet  . CMP14+EGFR  . Lipid panel  . Hepatitis C antibody    Caryl Pina, MD [facility] 06/27/2016, 10:51 AM

## 2016-06-28 LAB — CMP14+EGFR
ALBUMIN: 4.4 g/dL (ref 3.6–4.8)
ALT: 11 IU/L (ref 0–44)
AST: 19 IU/L (ref 0–40)
Albumin/Globulin Ratio: 1.5 (ref 1.2–2.2)
Alkaline Phosphatase: 65 IU/L (ref 39–117)
BUN / CREAT RATIO: 18 (ref 10–24)
BUN: 18 mg/dL (ref 8–27)
Bilirubin Total: 0.2 mg/dL (ref 0.0–1.2)
CALCIUM: 9.2 mg/dL (ref 8.6–10.2)
CO2: 22 mmol/L (ref 20–29)
CREATININE: 0.98 mg/dL (ref 0.76–1.27)
Chloride: 100 mmol/L (ref 96–106)
GFR, EST AFRICAN AMERICAN: 92 mL/min/{1.73_m2} (ref >59)
GFR, EST NON AFRICAN AMERICAN: 80 mL/min/{1.73_m2} (ref >59)
Globulin, Total: 3 g/dL (ref 1.5–4.5)
Glucose: 114 mg/dL — ABNORMAL HIGH (ref 65–99)
Potassium: 3.9 mmol/L (ref 3.5–5.2)
SODIUM: 141 mmol/L (ref 134–144)
TOTAL PROTEIN: 7.4 g/dL (ref 6.0–8.5)

## 2016-06-28 LAB — CBC WITH DIFFERENTIAL/PLATELET
BASOS: 0 %
Basophils Absolute: 0 10*3/uL (ref 0.0–0.2)
EOS (ABSOLUTE): 0.1 10*3/uL (ref 0.0–0.4)
EOS: 2 %
HEMATOCRIT: 38.7 % (ref 37.5–51.0)
HEMOGLOBIN: 12.2 g/dL — AB (ref 13.0–17.7)
IMMATURE GRANS (ABS): 0 10*3/uL (ref 0.0–0.1)
IMMATURE GRANULOCYTES: 0 %
Lymphocytes Absolute: 1.8 10*3/uL (ref 0.7–3.1)
Lymphs: 32 %
MCH: 27.7 pg (ref 26.6–33.0)
MCHC: 31.5 g/dL (ref 31.5–35.7)
MCV: 88 fL (ref 79–97)
MONOCYTES: 9 %
Monocytes Absolute: 0.5 10*3/uL (ref 0.1–0.9)
NEUTROS PCT: 57 %
Neutrophils Absolute: 3.1 10*3/uL (ref 1.4–7.0)
Platelets: 190 10*3/uL (ref 150–379)
RBC: 4.4 x10E6/uL (ref 4.14–5.80)
RDW: 14.1 % (ref 12.3–15.4)
WBC: 5.6 10*3/uL (ref 3.4–10.8)

## 2016-06-28 LAB — LIPID PANEL
CHOL/HDL RATIO: 3.5 ratio (ref 0.0–5.0)
Cholesterol, Total: 121 mg/dL (ref 100–199)
HDL: 35 mg/dL — ABNORMAL LOW (ref >39)
LDL CALC: 72 mg/dL (ref 0–99)
TRIGLYCERIDES: 72 mg/dL (ref 0–149)
VLDL Cholesterol Cal: 14 mg/dL (ref 5–40)

## 2016-06-28 LAB — HEPATITIS C ANTIBODY: Hep C Virus Ab: <0.1 s/co ratio (ref 0.0–0.9)

## 2016-10-12 DIAGNOSIS — Z23 Encounter for immunization: Secondary | ICD-10-CM | POA: Diagnosis not present

## 2016-11-21 ENCOUNTER — Encounter: Payer: Self-pay | Admitting: *Deleted

## 2016-12-28 ENCOUNTER — Encounter: Payer: Self-pay | Admitting: Family Medicine

## 2016-12-28 ENCOUNTER — Ambulatory Visit (INDEPENDENT_AMBULATORY_CARE_PROVIDER_SITE_OTHER): Payer: Medicare Other | Admitting: Family Medicine

## 2016-12-28 VITALS — BP 128/85 | HR 72 | Temp 97.0°F | Ht 68.0 in | Wt 200.0 lb

## 2016-12-28 DIAGNOSIS — E785 Hyperlipidemia, unspecified: Secondary | ICD-10-CM | POA: Diagnosis not present

## 2016-12-28 DIAGNOSIS — K219 Gastro-esophageal reflux disease without esophagitis: Secondary | ICD-10-CM | POA: Diagnosis not present

## 2016-12-28 DIAGNOSIS — R7303 Prediabetes: Secondary | ICD-10-CM

## 2016-12-28 DIAGNOSIS — I10 Essential (primary) hypertension: Secondary | ICD-10-CM

## 2016-12-28 LAB — BAYER DCA HB A1C WAIVED: HB A1C: 6.1 % (ref <7.0)

## 2016-12-28 NOTE — Progress Notes (Signed)
BP 128/85   Pulse 72   Temp (!) 97 F (36.1 C) (Oral)   Ht '5\' 8"'$  (1.727 m)   Wt 200 lb (90.7 kg)   BMI 30.41 kg/m    Subjective:    Patient ID: Johnathan [name], male    DOB: 1950-10-22, 66 y.o.   MRN: [id number]  HPI: [place] KORVER is a 66 y.o. male presenting on 12/28/2016 for Hyperlipidemia (6 mo follow up; patient is fasting); Hypertension; and Diabetes   HPI Prediabetes Patient comes in today for recheck of his diabetes. Patient has been currently taking diet control. Patient is currently on an ACE inhibitor/ARB. Patient has seen an ophthalmologist this year. Patient denies any issues with their feet.  Hypertension Patient is currently on lisinopril and hydrochlorothiazide, and their blood pressure today is 128/85. Patient denies any lightheadedness or dizziness. Patient denies headaches, blurred vision, chest pains, shortness of breath, or weakness. Denies any side effects from medication and is content with current medication.   Hyperlipidemia Patient is coming in for recheck of his hyperlipidemia. The patient is currently taking Lipitor but says he is not taking. They deny any issues with myalgias or history of liver damage from it. They deny any focal numbness or weakness or chest pain.   GERD Patient is currently on omeprazole.  he denies any major symptoms or abdominal pain or belching or burping. She denies any blood in her stool or lightheadedness or dizziness.    Relevant past medical, surgical, family and social history reviewed and updated as indicated. Interim medical history since our last visit reviewed. Allergies and medications reviewed and updated.  Review of Systems  Constitutional: Negative for chills and fever.  Eyes: Negative for discharge.  Respiratory: Negative for shortness of breath and wheezing.   Cardiovascular: Negative for chest pain and leg swelling.  Gastrointestinal: Negative for abdominal pain.  Musculoskeletal: Negative for back pain  and gait problem.  Skin: Negative for rash.  Neurological: Negative for dizziness, weakness, light-headedness, numbness and headaches.  All other systems reviewed and are negative.   Per HPI unless specifically indicated above        Objective:    BP 128/85   Pulse 72   Temp (!) 97 F (36.1 C) (Oral)   Ht '5\' 8"'$  (1.727 m)   Wt 200 lb (90.7 kg)   BMI 30.41 kg/m   Wt Readings from Last 3 Encounters:  12/28/16 200 lb (90.7 kg)  06/27/16 204 lb (92.5 kg)  12/28/15 206 lb (93.4 kg)    Physical Exam  Constitutional: He is oriented to person, place, and time. He appears well-developed and well-nourished. No distress.  Eyes: Conjunctivae are normal. No scleral icterus.  Neck: Neck supple. No thyromegaly present.  Cardiovascular: Normal rate, regular rhythm, normal heart sounds and intact distal pulses.  No murmur heard. Pulmonary/Chest: Effort normal and breath sounds normal. No respiratory distress. He has no wheezes.  Abdominal: Soft. Bowel sounds are normal. He exhibits no distension. There is no tenderness.  Musculoskeletal: Normal range of motion. He exhibits no edema.  Lymphadenopathy:    He has no cervical adenopathy.  Neurological: He is alert and oriented to person, place, and time. Coordination normal.  Skin: Skin is warm and dry. No rash noted. He is not diaphoretic.  Psychiatric: He has a normal mood and affect. His behavior is normal.  Nursing note and vitals reviewed.       Assessment & Plan:   Problem List Items Addressed This Visit  Cardiovascular and Mediastinum   Hypertension - Primary   Relevant Medications   pravastatin (PRAVACHOL) 40 MG tablet   Other Relevant Orders   CMP14+EGFR     Digestive   GERD (gastroesophageal reflux disease)   Relevant Orders   CBC with Differential/Platelet     Other   Hyperlipidemia LDL goal <130   Relevant Medications   pravastatin (PRAVACHOL) 40 MG tablet   Other Relevant Orders   Lipid panel    Prediabetes   Relevant Orders   Bayer DCA Hb A1c Waived   CMP14+EGFR       Follow up plan: Return in about 6 months (around 06/28/2017), or if symptoms worsen or fail to improve, for Recheck hypertension and cholesterol.  Counseling provided for all of the vaccine components Orders Placed This Encounter  Procedures  . Bayer DCA Hb A1c Waived  . CMP14+EGFR  . Lipid panel  . CBC with Differential/Platelet    Caryl Pina, MD [facility] 12/28/2016, 11:13 AM

## 2016-12-29 LAB — CBC WITH DIFFERENTIAL/PLATELET
BASOS ABS: 0 10*3/uL (ref 0.0–0.2)
Basos: 0 %
EOS (ABSOLUTE): 0.1 10*3/uL (ref 0.0–0.4)
Eos: 1 %
Hematocrit: 40.1 % (ref 37.5–51.0)
Hemoglobin: 13.2 g/dL (ref 13.0–17.7)
IMMATURE GRANS (ABS): 0 10*3/uL (ref 0.0–0.1)
IMMATURE GRANULOCYTES: 0 %
LYMPHS: 30 %
Lymphocytes Absolute: 1.9 10*3/uL (ref 0.7–3.1)
MCH: 27.6 pg (ref 26.6–33.0)
MCHC: 32.9 g/dL (ref 31.5–35.7)
MCV: 84 fL (ref 79–97)
MONOS ABS: 0.5 10*3/uL (ref 0.1–0.9)
Monocytes: 8 %
NEUTROS PCT: 61 %
Neutrophils Absolute: 3.7 10*3/uL (ref 1.4–7.0)
PLATELETS: 212 10*3/uL (ref 150–379)
RBC: 4.79 x10E6/uL (ref 4.14–5.80)
RDW: 15.7 % — AB (ref 12.3–15.4)
WBC: 6.2 10*3/uL (ref 3.4–10.8)

## 2016-12-29 LAB — CMP14+EGFR
ALBUMIN: 4.6 g/dL (ref 3.6–4.8)
ALK PHOS: 76 IU/L (ref 39–117)
ALT: 14 IU/L (ref 0–44)
AST: 21 IU/L (ref 0–40)
Albumin/Globulin Ratio: 1.8 (ref 1.2–2.2)
BUN / CREAT RATIO: 17 (ref 10–24)
BUN: 16 mg/dL (ref 8–27)
Bilirubin Total: 0.3 mg/dL (ref 0.0–1.2)
CALCIUM: 9.4 mg/dL (ref 8.6–10.2)
CO2: 23 mmol/L (ref 20–29)
CREATININE: 0.96 mg/dL (ref 0.76–1.27)
Chloride: 103 mmol/L (ref 96–106)
GFR, EST AFRICAN AMERICAN: 94 mL/min/{1.73_m2} (ref >59)
GFR, EST NON AFRICAN AMERICAN: 81 mL/min/{1.73_m2} (ref >59)
GLOBULIN, TOTAL: 2.6 g/dL (ref 1.5–4.5)
Glucose: 120 mg/dL — ABNORMAL HIGH (ref 65–99)
Potassium: 3.7 mmol/L (ref 3.5–5.2)
SODIUM: 142 mmol/L (ref 134–144)
TOTAL PROTEIN: 7.2 g/dL (ref 6.0–8.5)

## 2016-12-29 LAB — LIPID PANEL
CHOL/HDL RATIO: 3.9 ratio (ref 0.0–5.0)
Cholesterol, Total: 136 mg/dL (ref 100–199)
HDL: 35 mg/dL — ABNORMAL LOW (ref >39)
LDL CALC: 81 mg/dL (ref 0–99)
Triglycerides: 98 mg/dL (ref 0–149)
VLDL CHOLESTEROL CAL: 20 mg/dL (ref 5–40)

## 2017-01-19 ENCOUNTER — Encounter: Payer: Medicare Other | Attending: Family Medicine | Admitting: Nutrition

## 2017-01-19 ENCOUNTER — Encounter: Payer: Self-pay | Admitting: Nutrition

## 2017-01-19 VITALS — Ht 68.0 in | Wt 202.0 lb

## 2017-01-19 DIAGNOSIS — E1165 Type 2 diabetes mellitus with hyperglycemia: Secondary | ICD-10-CM

## 2017-01-19 DIAGNOSIS — IMO0002 Reserved for concepts with insufficient information to code with codable children: Secondary | ICD-10-CM

## 2017-01-19 DIAGNOSIS — Z713 Dietary counseling and surveillance: Secondary | ICD-10-CM | POA: Diagnosis not present

## 2017-01-19 DIAGNOSIS — E119 Type 2 diabetes mellitus without complications: Secondary | ICD-10-CM | POA: Insufficient documentation

## 2017-01-19 DIAGNOSIS — E118 Type 2 diabetes mellitus with unspecified complications: Secondary | ICD-10-CM

## 2017-01-19 NOTE — Progress Notes (Signed)
  Medical Nutrition Therapy:  Appt start time: 1430 end time:  1600.  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 In November, [place] in [place], [place] with wife and she and he doe the shopping. MOst foods are baked and less fried.  Eats 2-3 meals per day. Snacks at night on nuts and fruit. HOH with hearing aide.  Preferred Learning Style:   No preference indicated   Learning Readiness: Ready  Change in progress   MEDICATIONS:    DIETARY INTAKE:    24-hr recall:  B ( 11-12 AM): eggs, link sausage 3 and grits.1 cup, Milk- 2% milk, 12 oz Snk ( AM):  D ( PM): Chicken wings 6, and salad- Ranch dressing,  Mello Yellow Snk ( PM): orange, nuts Beverages: sodas, water, sweet tea  Usual physical activity: Walking 2-3 timees per week. 3 miles  Estimated energy needs: 1800  calories 200  g carbohydrates 135 g protein  50 g fat  Progress Towards Goal(s):  In progress.   Nutritional Diagnosis:  NB-1.1 Food and nutrition-related knowledge deficit As related to DIabetes.  As evidenced by A1C 6.7%.    Intervention:   Nutrition and Diabetes education provided on My Plate, CHO counting, meal planning, portion sizes, timing of meals, avoiding snacks between meals unless having a low blood sugar, target ranges for A1C and blood sugars, signs/symptoms and treatment of hyper/hypoglycemia, monitoring blood sugars, taking medications as prescribed, benefits of exercising 30 minutes per day and prevention of complications of DM. Goals 1. Follow My Plate 2. Increase vegetables and fruit 3. Cut down on eating out and less processed foods 4. Eat breakfast daily. Down down on fat, fried foods and sodas/sweet tea. Continue to walk daily.  Teaching Method Utilized: Visual Auditory Hands on  Handouts given during visit include:  The Plate Method  Meal Plan Card  Diabetes Instructions.   Barriers to learning/adherence to lifestyle change: none  Demonstrated degree  of understanding via:  Teach Back   Monitoring/Evaluation:  Dietary intake, exercise, meal planning, SBG, and body weight in 3 month(s).

## 2017-01-19 NOTE — Progress Notes (Signed)
  Medical Nutrition Therapy:  Appt start time: 1430 end time:  1600.  Assessment:  Primary concerns today: Diabetes Type 2, A1C 6.7%. In November. Goes to the  [employer] in [place], [place]. Here with his wife., [place] with wife and she and he both do the shopping. Most foods are baked and less fried.  Eats 2-3 meals per day. Snacks at night on nuts and fruit. HOH with hearing aide. He likes whole milk and a lot of butter or fats on his foods.Prefers fried foods. Drinks some soda when eating out.  Walking 3-4 miles a few times per week. Engaged to make changes with his diet to improve his BS. PCP Dr. Glean Hess and A1C most recently was 6.1%.  Preferred Learning Style:   No preference indicated   Learning Readiness: Ready  Change in progress   MEDICATIONS:    DIETARY INTAKE:    24-hr recall:  B ( 11-12 AM): eggs, link sausage 3 and grits.1 cup, Milk- 2% milk, 12 oz Snk ( AM):  D ( PM): Chicken wings 6, and salad- Ranch dressing,  Mello Yellow Snk ( PM): orange, nuts Beverages: sodas, water, sweet tea  Usual physical activity: Walking 2-3 timees per week. 3 miles  Estimated energy needs: 1800  calories 200 g carbohydrates 135 g protein 50 g fat  Progress Towards Goal(s):  In progress.   Nutritional Diagnosis:  NB-1.1 Food and nutrition-related knowledge deficit As related to Diabetes Type 2.  As evidenced by  A1C 6.7%.    Intervention:  Nutrition and Diabetes education provided on My Plate, CHO counting, meal planning, portion sizes, timing of meals, avoiding snacks between meals unless having a low blood sugar, target ranges for A1C and blood sugars, signs/symptoms and treatment of hyper/hypoglycemia, monitoring blood sugars, taking medications as prescribed, benefits of exercising 30 minutes per day and prevention of complications of DM.  Goals 1. Follow My Plate 2. Increase vegetables and fruit 3. Cut down on eating out and less processed foods 4. Eat breakfast  daily. Down down on fat, fried foods and sodas/sweet tea. Continue to walk daily.  Teaching Method Utilized: Visual Auditory Hands on  Handouts given during visit include:  The Plate Method  Meal Plan  Diabetes Instructions.   Barriers to learning/adherence to lifestyle change: none  Demonstrated degree of understanding via:  Teach Back   Monitoring/Evaluation:  Dietary intake, exercise, meal planing, SBG, and body weight in 3 month(s).

## 2017-01-19 NOTE — Patient Instructions (Addendum)
Goals 1. Follow My Plate 2. Increase vegetables and fruit 3. Cut down on eating out and less processed foods 4. Eat breakfast daily. Down down on fat, fried foods and sodas/sweet tea. Continue to walk daily.

## 2017-03-16 ENCOUNTER — Other Ambulatory Visit: Payer: Self-pay | Admitting: Family Medicine

## 2017-03-16 DIAGNOSIS — K219 Gastro-esophageal reflux disease without esophagitis: Secondary | ICD-10-CM

## 2017-03-16 DIAGNOSIS — I10 Essential (primary) hypertension: Secondary | ICD-10-CM

## 2017-03-16 MED ORDER — PRAVASTATIN SODIUM 40 MG PO TABS: 40.0000 mg | ORAL_TABLET | Freq: Every day | ORAL | 3 refills | Status: DC

## 2017-03-16 MED ORDER — HYDROCHLOROTHIAZIDE 25 MG PO TABS
25.0000 mg | ORAL_TABLET | Freq: Every day | ORAL | 3 refills | Status: DC
Start: 2017-03-16 — End: 2018-04-06

## 2017-03-16 MED ORDER — LISINOPRIL 40 MG PO TABS
40.0000 mg | ORAL_TABLET | Freq: Every day | ORAL | 3 refills | Status: DC
Start: 2017-03-16 — End: 2018-03-20

## 2017-03-16 MED ORDER — OMEPRAZOLE 40 MG PO CPDR
40.0000 mg | DELAYED_RELEASE_CAPSULE | Freq: Every day | ORAL | 2 refills | Status: DC
Start: 2017-03-16 — End: 2017-11-29

## 2017-03-16 NOTE — Telephone Encounter (Signed)
Pt here recently - rf sent in / pt aware

## 2017-03-16 NOTE — Addendum Note (Signed)
Addended by: Zannie Cove on: 03/16/2017 03:52 PM   Modules accepted: Orders

## 2017-04-17 ENCOUNTER — Ambulatory Visit: Payer: BLUE CROSS/BLUE SHIELD | Admitting: Nutrition

## 2017-04-28 DIAGNOSIS — E785 Hyperlipidemia, unspecified: Secondary | ICD-10-CM | POA: Diagnosis not present

## 2017-04-28 DIAGNOSIS — J309 Allergic rhinitis, unspecified: Secondary | ICD-10-CM | POA: Diagnosis not present

## 2017-04-28 DIAGNOSIS — I951 Orthostatic hypotension: Secondary | ICD-10-CM | POA: Diagnosis not present

## 2017-04-28 DIAGNOSIS — K219 Gastro-esophageal reflux disease without esophagitis: Secondary | ICD-10-CM | POA: Diagnosis not present

## 2017-04-28 DIAGNOSIS — K08409 Partial loss of teeth, unspecified cause, unspecified class: Secondary | ICD-10-CM | POA: Diagnosis not present

## 2017-04-28 DIAGNOSIS — E669 Obesity, unspecified: Secondary | ICD-10-CM | POA: Diagnosis not present

## 2017-04-28 DIAGNOSIS — I10 Essential (primary) hypertension: Secondary | ICD-10-CM | POA: Diagnosis not present

## 2017-04-28 DIAGNOSIS — R609 Edema, unspecified: Secondary | ICD-10-CM | POA: Diagnosis not present

## 2017-04-28 DIAGNOSIS — G629 Polyneuropathy, unspecified: Secondary | ICD-10-CM | POA: Diagnosis not present

## 2017-04-28 DIAGNOSIS — S0502XD Injury of conjunctiva and corneal abrasion without foreign body, left eye, subsequent encounter: Secondary | ICD-10-CM | POA: Diagnosis not present

## 2017-06-14 ENCOUNTER — Ambulatory Visit: Payer: BLUE CROSS/BLUE SHIELD | Admitting: Nutrition

## 2017-06-28 ENCOUNTER — Ambulatory Visit: Payer: Medicare Other | Admitting: Family Medicine

## 2017-09-13 DIAGNOSIS — R69 Illness, unspecified: Secondary | ICD-10-CM | POA: Diagnosis not present

## 2017-10-14 DIAGNOSIS — R69 Illness, unspecified: Secondary | ICD-10-CM | POA: Diagnosis not present

## 2017-10-16 ENCOUNTER — Telehealth: Payer: Self-pay | Admitting: Family Medicine

## 2017-10-16 ENCOUNTER — Other Ambulatory Visit: Payer: Self-pay | Admitting: Nurse Practitioner

## 2017-10-16 ENCOUNTER — Encounter: Payer: Self-pay | Admitting: Nurse Practitioner

## 2017-10-16 ENCOUNTER — Ambulatory Visit (INDEPENDENT_AMBULATORY_CARE_PROVIDER_SITE_OTHER): Payer: Medicare HMO | Admitting: Nurse Practitioner

## 2017-10-16 VITALS — BP 119/76 | HR 85 | Temp 97.4°F | Ht 68.0 in | Wt 198.0 lb

## 2017-10-16 DIAGNOSIS — H60332 Swimmer's ear, left ear: Secondary | ICD-10-CM | POA: Diagnosis not present

## 2017-10-16 MED ORDER — OFLOXACIN 0.3 % OT SOLN
5.0000 [drp] | Freq: Every day | OTIC | 0 refills | Status: DC
Start: 2017-10-16 — End: 2018-07-02

## 2017-10-16 MED ORDER — NEOMYCIN-POLYMYXIN-HC 3.5-10000-1 OT SOLN: 3.0000 [drp] | Freq: Three times a day (TID) | OTIC | 0 refills | Status: DC

## 2017-10-16 MED ORDER — CIPROFLOXACIN-DEXAMETHASONE 0.3-0.1 % OT SUSP: 4.0000 [drp] | Freq: Two times a day (BID) | OTIC | 0 refills | Status: DC

## 2017-10-16 NOTE — Telephone Encounter (Signed)
Please review and advise.

## 2017-10-16 NOTE — Telephone Encounter (Signed)
Seen today  Too expensive  Can you change

## 2017-10-16 NOTE — Patient Instructions (Signed)
Otitis Externa Otitis externa is an infection of the outer ear canal. The outer ear canal is the area between the outside of the ear and the eardrum. Otitis externa is sometimes called "swimmer's ear." Follow these instructions at home:  If you were given antibiotic ear drops, use them as told by your doctor. Do not stop using them even if your condition gets better.  Take over-the-counter and prescription medicines only as told by your doctor.  Keep all follow-up visits as told by your doctor. This is important. How is this prevented?  Keep your ear dry. Use the corner of a towel to dry your ear after you swim or bathe.  Try not to scratch or put things in your ear. Doing these things makes it easier for germs to grow in your ear.  Avoid swimming in lakes, dirty water, or pools that may not have the right amount of a chemical called chlorine.  Consider making ear drops and putting 3 or 4 drops in each ear after you swim. Ask your doctor about how you can make ear drops. Contact a doctor if:  You have a fever.  After 3 days your ear is still red, swollen, or painful.  After 3 days you still have pus coming from your ear.  Your redness, swelling, or pain gets worse.  You have a really bad headache.  You have redness, swelling, pain, or tenderness behind your ear. This information is not intended to replace advice given to you by your health care provider. Make sure you discuss any questions you have with your health care provider. Document Released: 06/15/2007 Document Revised: 01/22/2015 Document Reviewed: 10/06/2014 Elsevier Interactive Patient Education  2018 [employer]vier Inc[employer]  

## 2017-10-16 NOTE — Progress Notes (Signed)
   Subjective:    Patient ID: Johnathan [name], male    DOB: 08-02-1950, 67 y.o.   MRN: [id number]   Chief Complaint: Ear Pain (left)   HPI Patient come sin with his wife c/o ear ache on left . Started a few days ago and is no better. Is worse at night.    Review of Systems  Constitutional: Negative.   HENT: Positive for ear pain.   Respiratory: Negative.   Cardiovascular: Negative.   Genitourinary: Negative.   Neurological: Negative.   Psychiatric/Behavioral: Negative.   All other systems reviewed and are negative.      Objective:   Physical Exam  Constitutional: He is oriented to person, place, and time. He appears well-nourished. He appears distressed (mild).  HENT:  Right Ear: Hearing, tympanic membrane, external ear and ear canal normal.  Left Ear: Tympanic membrane normal. There is drainage (yellowish excudtae), swelling (ear canal) and tenderness (tragus).  Nose: Nose normal.  Mouth/Throat: Uvula is midline, oropharynx is clear and moist and mucous membranes are normal.  Neck: Normal range of motion. Neck supple.  Cardiovascular: Normal rate and regular rhythm.  Pulmonary/Chest: Effort normal.  Abdominal: Soft. Bowel sounds are normal.  Neurological: He is alert and oriented to person, place, and time.  Skin: Skin is warm and dry.  Psychiatric: He has a normal mood and affect. His behavior is normal. Judgment and thought content normal.   BP 119/76   Pulse 85   Temp (!) 97.4 F (36.3 C) (Oral)   Ht 5\' 8"  (1.727 m)   Wt 198 lb (89.8 kg)   BMI 30.11 kg/m       Assessment & Plan:  1. Acute swimmer's ear of left side Avoid getting water in ear No q tips RTO prn  Meds ordered this encounter  Medications  . ciprofloxacin-dexamethasone (CIPRODEX) OTIC suspension    Sig: Place 4 drops into the left ear 2 (two) times daily.    Dispense:  7.5 mL    Refill:  0    Order Specific Question:   Supervising Provider    Answer:   Eustaquio Maize [[id number]]    Mary-Margaret Hassell Done, FNP

## 2017-10-16 NOTE — Telephone Encounter (Signed)
Left detailed message for pt regarding new RX

## 2017-10-16 NOTE — Telephone Encounter (Signed)
New rx sent to pharmacy

## 2017-10-24 ENCOUNTER — Ambulatory Visit (INDEPENDENT_AMBULATORY_CARE_PROVIDER_SITE_OTHER): Payer: Medicare HMO | Admitting: Family Medicine

## 2017-10-24 ENCOUNTER — Encounter: Payer: Self-pay | Admitting: Family Medicine

## 2017-10-24 VITALS — BP 121/76 | HR 73 | Temp 97.3°F | Ht 68.0 in | Wt 197.8 lb

## 2017-10-24 DIAGNOSIS — H60332 Swimmer's ear, left ear: Secondary | ICD-10-CM

## 2017-10-24 MED ORDER — NEOMYCIN-POLYMYXIN-HC 3.5-10000-1 OT SOLN: 3.0000 [drp] | Freq: Three times a day (TID) | OTIC | 0 refills | Status: AC

## 2017-10-24 NOTE — Progress Notes (Signed)
BP 121/76   Pulse 73   Temp (!) 97.3 F (36.3 C) (Oral)   Ht 5\' 8"  (1.727 m)   Wt 197 lb 12.8 oz (89.7 kg)   BMI 30.08 kg/m    Subjective:    Patient ID: Johnathan [name], male    DOB: [birth year], 67 y.o.   MRN: [id number]  HPI: Johnathan [name] is a 67 y.o. male presenting on 10/24/2017 for Ear Pain (Left- Patient was seen 10/7 and states it is no better.)   HPI Left ear pain Patient comes in complaining of left ear pain that is been going on for almost 2 weeks now.  He was seen on 10/17/1998 19 x 1 of my colleagues and prescribed drops for it but he says is not getting any better despite having been doing the drops as prescribed since that day.  Patient denies any fevers or chills or cough or congestion or anything else except pressure and drainage out of that left ear.  He feels like the drops are not getting in and that they just sit there and then come back out because his ear is so plugged up.  Relevant past medical, surgical, family and social history reviewed and updated as indicated. Interim medical history since our last visit reviewed. Allergies and medications reviewed and updated.  Review of Systems  Constitutional: Negative for chills and fever.  HENT: Positive for ear discharge and ear pain. Negative for congestion, rhinorrhea, sinus pressure, sinus pain and sore throat.   Respiratory: Negative for cough, shortness of breath and wheezing.   Cardiovascular: Negative for chest pain and leg swelling.  Musculoskeletal: Negative for back pain and gait problem.  Skin: Negative for rash.  All other systems reviewed and are negative.   Per HPI unless specifically indicated above   Allergies as of 10/24/2017   No Known Allergies     Medication List        Accurate as of 10/24/17  5:19 PM. Always use your most recent med list.          GLUCOSAMINE 1500 COMPLEX PO Take by mouth.   hydrochlorothiazide 25 MG tablet Commonly known as:  HYDRODIURIL Take 1 tablet  (25 mg total) by mouth daily.   lisinopril 40 MG tablet Commonly known as:  PRINIVIL,ZESTRIL Take 1 tablet (40 mg total) by mouth daily.   neomycin-polymyxin-hydrocortisone OTIC solution Commonly known as:  CORTISPORIN Place 3 drops into the left ear 3 (three) times daily for 10 days.   ofloxacin 0.3 % OTIC solution Commonly known as:  FLOXIN Place 5 drops into both ears daily.   omeprazole 40 MG capsule Commonly known as:  PRILOSEC Take 1 capsule (40 mg total) by mouth daily.   oxyCODONE 5 MG immediate release tablet Commonly known as:  Oxy IR/ROXICODONE Take 1-2 tablets (5-10 mg total) by mouth every 4 (four) hours as needed for moderate pain or severe pain.   pravastatin 40 MG tablet Commonly known as:  PRAVACHOL Take 1 tablet (40 mg total) by mouth daily.          Objective:    BP 121/76   Pulse 73   Temp (!) 97.3 F (36.3 C) (Oral)   Ht 5\' 8"  (1.727 m)   Wt 197 lb 12.8 oz (89.7 kg)   BMI 30.08 kg/m   Wt Readings from Last 3 Encounters:  10/24/17 197 lb 12.8 oz (89.7 kg)  10/16/17 198 lb (89.8 kg)  01/19/17 202 lb (91.6 kg)  Physical Exam  Constitutional: He is oriented to person, place, and time. He appears well-developed and well-nourished. No distress.  HENT:  Right Ear: Tympanic membrane, external ear and ear canal normal.  Left Ear: There is drainage and swelling (Unable to visualize tympanic membrane).  Eyes: Conjunctivae are normal. No scleral icterus.  Neck: Neck supple. No thyromegaly present.  Musculoskeletal: Normal range of motion. He exhibits no edema.  Lymphadenopathy:    He has no cervical adenopathy.  Neurological: He is alert and oriented to person, place, and time. Coordination normal.  Skin: Skin is warm and dry. No rash noted. He is not diaphoretic.  Psychiatric: He has a normal mood and affect. His behavior is normal.  Nursing note and vitals reviewed.   Cerumen lavage: Patient's ear is significantly congested due to cerumen and  discharge and purulence, will do lavage and then treat with drops from there.    Assessment & Plan:   Problem List Items Addressed This Visit    None    Visit Diagnoses    Acute swimmer's ear of left side    -  Primary   Significant amount of drainage that is blocking the tympanic membrane, drops not getting when the need to be, will do lavage and then drops       Follow up plan: Return if symptoms worsen or fail to improve.  Counseling provided for all of the vaccine components No orders of the defined types were placed in this encounter.   Caryl Pina, MD [facility] 10/24/2017, 5:19 PM

## 2017-10-31 ENCOUNTER — Telehealth: Payer: Self-pay | Admitting: Family Medicine

## 2017-10-31 DIAGNOSIS — H9209 Otalgia, unspecified ear: Secondary | ICD-10-CM

## 2017-11-01 NOTE — Telephone Encounter (Signed)
Go ahead and do a referral to ENT form, ear pain diagnosis

## 2017-11-01 NOTE — Telephone Encounter (Signed)
Referral placed.

## 2017-11-23 ENCOUNTER — Ambulatory Visit (INDEPENDENT_AMBULATORY_CARE_PROVIDER_SITE_OTHER): Payer: Medicare HMO | Admitting: Otolaryngology

## 2017-11-23 DIAGNOSIS — H903 Sensorineural hearing loss, bilateral: Secondary | ICD-10-CM

## 2017-11-23 DIAGNOSIS — H60332 Swimmer's ear, left ear: Secondary | ICD-10-CM

## 2017-11-29 ENCOUNTER — Other Ambulatory Visit: Payer: Self-pay | Admitting: Family Medicine

## 2017-11-29 DIAGNOSIS — K219 Gastro-esophageal reflux disease without esophagitis: Secondary | ICD-10-CM

## 2017-12-11 ENCOUNTER — Ambulatory Visit (INDEPENDENT_AMBULATORY_CARE_PROVIDER_SITE_OTHER): Payer: Medicare HMO | Admitting: Otolaryngology

## 2017-12-11 DIAGNOSIS — H903 Sensorineural hearing loss, bilateral: Secondary | ICD-10-CM | POA: Diagnosis not present

## 2017-12-11 DIAGNOSIS — H6122 Impacted cerumen, left ear: Secondary | ICD-10-CM | POA: Diagnosis not present

## 2017-12-11 DIAGNOSIS — H6983 Other specified disorders of Eustachian tube, bilateral: Secondary | ICD-10-CM

## 2017-12-29 ENCOUNTER — Encounter: Payer: Self-pay | Admitting: Family Medicine

## 2017-12-29 ENCOUNTER — Ambulatory Visit (INDEPENDENT_AMBULATORY_CARE_PROVIDER_SITE_OTHER): Payer: Medicare HMO | Admitting: Family Medicine

## 2017-12-29 VITALS — BP 139/82 | HR 73 | Temp 97.1°F | Ht 68.0 in | Wt 205.4 lb

## 2017-12-29 DIAGNOSIS — I10 Essential (primary) hypertension: Secondary | ICD-10-CM

## 2017-12-29 DIAGNOSIS — R3912 Poor urinary stream: Secondary | ICD-10-CM

## 2017-12-29 DIAGNOSIS — R7303 Prediabetes: Secondary | ICD-10-CM | POA: Diagnosis not present

## 2017-12-29 DIAGNOSIS — E785 Hyperlipidemia, unspecified: Secondary | ICD-10-CM | POA: Diagnosis not present

## 2017-12-29 DIAGNOSIS — N529 Male erectile dysfunction, unspecified: Secondary | ICD-10-CM

## 2017-12-29 DIAGNOSIS — K219 Gastro-esophageal reflux disease without esophagitis: Secondary | ICD-10-CM

## 2017-12-29 LAB — BAYER DCA HB A1C WAIVED: HB A1C (BAYER DCA - WAIVED): 6.4 % (ref <7.0)

## 2017-12-29 MED ORDER — SILDENAFIL CITRATE 20 MG PO TABS: 20.0000 mg | ORAL_TABLET | ORAL | 1 refills | Status: DC | PRN

## 2017-12-29 NOTE — Progress Notes (Signed)
BP 139/82   Pulse 73   Temp (!) 97.1 F (36.2 C) (Oral)   Ht '5\' 8"'$  (1.727 m)   Wt 205 lb 6.4 oz (93.2 kg)   BMI 31.23 kg/m    Subjective:    Patient ID: Johnathan [name], male    DOB: January 26, 1950, 67 y.o.   MRN: [id number]  HPI: Johnathan [name] is a 67 y.o. male presenting on 12/29/2017 for Hypertension (check up of chronic medical conditions)   HPI Prediabetes Patient comes in today for recheck of his diabetes. Patient has been currently taking no medication has been doing diet control and his last A1c was 6.1- 9 months ago. Patient is currently on an ACE inhibitor/ARB. Patient has not seen an ophthalmologist this year. Patient denies any issues with their feet.   Hypertension Patient is currently on lisinopril and hydrochlorothiazide, and their blood pressure today is 139/82. Patient denies any lightheadedness or dizziness. Patient denies headaches, blurred vision, chest pains, shortness of breath, or weakness. Denies any side effects from medication and is content with current medication.   Hyperlipidemia Patient is coming in for recheck of his hyperlipidemia. The patient is currently taking pravastatin. They deny any issues with myalgias or history of liver damage from it. They deny any focal numbness or weakness or chest pain.   GERD Patient is currently on Meprazole.  She denies any major symptoms or abdominal pain or belching or burping. She denies any blood in her stool or lightheadedness or dizziness.   Weak urinary stream and erectile dysfunction Patient has noticed over the past year that he has been having gradually worsening erectile dysfunction and trouble getting an erection and is also had increased urinary stream issues where his stream is just slower than what it has been before.  He denies any incomplete emptying and he only wakes up currently 1-2 times per night most of the time and occasionally 3 if he drinks a lot.  He would like to try medication for erectile  dysfunction but says the urinary stream issue is not bad enough to try medication for it at this point.  Relevant past medical, surgical, family and social history reviewed and updated as indicated. Interim medical history since our last visit reviewed. Allergies and medications reviewed and updated.  Review of Systems  Constitutional: Negative for chills and fever.  Eyes: Negative for discharge and visual disturbance.  Respiratory: Negative for shortness of breath and wheezing.   Cardiovascular: Negative for chest pain and leg swelling.  Genitourinary: Positive for decreased urine volume. Negative for difficulty urinating, dysuria, frequency, hematuria and urgency.  Musculoskeletal: Negative for back pain and gait problem.  Skin: Negative for rash.  Neurological: Negative for dizziness, weakness, light-headedness and headaches.  All other systems reviewed and are negative.   Per HPI unless specifically indicated above   Allergies as of 12/29/2017   No Known Allergies     Medication List       Accurate as of December 29, 2017 10:43 AM. Always use your most recent med list.        GLUCOSAMINE 1500 COMPLEX PO Take by mouth.   hydrochlorothiazide 25 MG tablet Commonly known as:  HYDRODIURIL Take 1 tablet (25 mg total) by mouth daily.   lisinopril 40 MG tablet Commonly known as:  PRINIVIL,ZESTRIL Take 1 tablet (40 mg total) by mouth daily.   ofloxacin 0.3 % OTIC solution Commonly known as:  FLOXIN OTIC Place 5 drops into both ears daily.   omeprazole  40 MG capsule Commonly known as:  PRILOSEC TAKE 1 CAPSULE BY MOUTH EVERY DAY   oxyCODONE 5 MG immediate release tablet Commonly known as:  Oxy IR/ROXICODONE Take 1-2 tablets (5-10 mg total) by mouth every 4 (four) hours as needed for moderate pain or severe pain.   pravastatin 40 MG tablet Commonly known as:  PRAVACHOL Take 1 tablet (40 mg total) by mouth daily.   sildenafil 20 MG tablet Commonly known as:   REVATIO Take 1-3 tablets (20-60 mg total) by mouth as needed.          Objective:    BP 139/82   Pulse 73   Temp (!) 97.1 F (36.2 C) (Oral)   Ht '5\' 8"'$  (1.727 m)   Wt 205 lb 6.4 oz (93.2 kg)   BMI 31.23 kg/m   Wt Readings from Last 3 Encounters:  12/29/17 205 lb 6.4 oz (93.2 kg)  10/24/17 197 lb 12.8 oz (89.7 kg)  10/16/17 198 lb (89.8 kg)    Physical Exam Vitals signs and nursing note reviewed.  Constitutional:      General: He is not in acute distress.    Appearance: He is well-developed. He is not diaphoretic.  Eyes:     General: No scleral icterus.    Conjunctiva/sclera: Conjunctivae normal.  Neck:     Musculoskeletal: Neck supple.     Thyroid: No thyromegaly.  Cardiovascular:     Rate and Rhythm: Normal rate and regular rhythm.     Heart sounds: Normal heart sounds. No murmur.  Pulmonary:     Effort: Pulmonary effort is normal. No respiratory distress.     Breath sounds: Normal breath sounds. No wheezing.  Musculoskeletal: Normal range of motion.  Lymphadenopathy:     Cervical: No cervical adenopathy.  Skin:    General: Skin is warm and dry.     Findings: No rash.  Neurological:     Mental Status: He is alert and oriented to person, place, and time.     Coordination: Coordination normal.  Psychiatric:        Behavior: Behavior normal.         Assessment & Plan:   Problem List Items Addressed This Visit      Cardiovascular and Mediastinum   Hypertension - Primary   Relevant Medications   sildenafil (REVATIO) 20 MG tablet   Other Relevant Orders   CMP14+EGFR     Digestive   GERD (gastroesophageal reflux disease)   Relevant Orders   CBC with Differential/Platelet     Other   Hyperlipidemia LDL goal <130   Relevant Medications   sildenafil (REVATIO) 20 MG tablet   Other Relevant Orders   Lipid panel   Prediabetes   Relevant Orders   CMP14+EGFR   Bayer DCA Hb A1c Waived    Other Visit Diagnoses    Weak urinary stream       Patient  says is not bad enough that he wants to try medication for it yet but we will check a PSA.   Relevant Orders   PSA, total and free   Erectile dysfunction, unspecified erectile dysfunction type       Relevant Medications   sildenafil (REVATIO) 20 MG tablet       Follow up plan: Return in about 6 months (around 06/30/2018), or if symptoms worsen or fail to improve, for Hypertension and cholesterol recheck.  Counseling provided for all of the vaccine components Orders Placed This Encounter  Procedures  . CBC with Differential/Platelet  .  CMP14+EGFR  . Lipid panel  . Bayer DCA Hb A1c Waived  . PSA, total and free    Caryl Pina, MD [facility] 12/29/2017, 10:43 AM

## 2017-12-30 LAB — CBC WITH DIFFERENTIAL/PLATELET
Basophils Absolute: 0 10*3/uL (ref 0.0–0.2)
Basos: 1 %
EOS (ABSOLUTE): 0.1 10*3/uL (ref 0.0–0.4)
Eos: 2 %
Hematocrit: 39.5 % (ref 37.5–51.0)
Hemoglobin: 12.9 g/dL — ABNORMAL LOW (ref 13.0–17.7)
IMMATURE GRANS (ABS): 0 10*3/uL (ref 0.0–0.1)
IMMATURE GRANULOCYTES: 0 %
LYMPHS ABS: 1.6 10*3/uL (ref 0.7–3.1)
LYMPHS: 28 %
MCH: 27.8 pg (ref 26.6–33.0)
MCHC: 32.7 g/dL (ref 31.5–35.7)
MCV: 85 fL (ref 79–97)
MONOCYTES: 9 %
Monocytes Absolute: 0.5 10*3/uL (ref 0.1–0.9)
Neutrophils Absolute: 3.5 10*3/uL (ref 1.4–7.0)
Neutrophils: 60 %
PLATELETS: 187 10*3/uL (ref 150–450)
RBC: 4.64 x10E6/uL (ref 4.14–5.80)
RDW: 14.5 % (ref 12.3–15.4)
WBC: 5.7 10*3/uL (ref 3.4–10.8)

## 2017-12-30 LAB — CMP14+EGFR
ALK PHOS: 74 IU/L (ref 39–117)
ALT: 15 IU/L (ref 0–44)
AST: 21 IU/L (ref 0–40)
Albumin/Globulin Ratio: 1.6 (ref 1.2–2.2)
Albumin: 4.4 g/dL (ref 3.6–4.8)
BUN/Creatinine Ratio: 10 (ref 10–24)
BUN: 11 mg/dL (ref 8–27)
Bilirubin Total: 0.5 mg/dL (ref 0.0–1.2)
CALCIUM: 9.2 mg/dL (ref 8.6–10.2)
CO2: 22 mmol/L (ref 20–29)
CREATININE: 1.05 mg/dL (ref 0.76–1.27)
Chloride: 103 mmol/L (ref 96–106)
GFR calc Af Amer: 84 mL/min/{1.73_m2} (ref >59)
GFR, EST NON AFRICAN AMERICAN: 73 mL/min/{1.73_m2} (ref >59)
GLUCOSE: 107 mg/dL — AB (ref 65–99)
Globulin, Total: 2.7 g/dL (ref 1.5–4.5)
Potassium: 3.8 mmol/L (ref 3.5–5.2)
Sodium: 141 mmol/L (ref 134–144)
Total Protein: 7.1 g/dL (ref 6.0–8.5)

## 2017-12-30 LAB — PSA, TOTAL AND FREE
PSA FREE PCT: 23.9 %
PSA FREE: 0.86 ng/mL
Prostate Specific Ag, Serum: 3.6 ng/mL (ref 0.0–4.0)

## 2017-12-30 LAB — LIPID PANEL
CHOL/HDL RATIO: 4.1 ratio (ref 0.0–5.0)
CHOLESTEROL TOTAL: 157 mg/dL (ref 100–199)
HDL: 38 mg/dL — AB (ref >39)
LDL CALC: 92 mg/dL (ref 0–99)
TRIGLYCERIDES: 135 mg/dL (ref 0–149)
VLDL Cholesterol Cal: 27 mg/dL (ref 5–40)

## 2018-01-20 ENCOUNTER — Other Ambulatory Visit: Payer: Self-pay | Admitting: Family Medicine

## 2018-01-20 DIAGNOSIS — N529 Male erectile dysfunction, unspecified: Secondary | ICD-10-CM

## 2018-01-24 ENCOUNTER — Other Ambulatory Visit: Payer: Self-pay | Admitting: Dermatology

## 2018-01-24 DIAGNOSIS — D234 Other benign neoplasm of skin of scalp and neck: Secondary | ICD-10-CM | POA: Diagnosis not present

## 2018-01-24 DIAGNOSIS — D485 Neoplasm of uncertain behavior of skin: Secondary | ICD-10-CM | POA: Diagnosis not present

## 2018-01-24 DIAGNOSIS — L111 Transient acantholytic dermatosis [Grover]: Secondary | ICD-10-CM | POA: Diagnosis not present

## 2018-03-13 ENCOUNTER — Other Ambulatory Visit: Payer: Self-pay | Admitting: Dermatology

## 2018-03-13 DIAGNOSIS — D485 Neoplasm of uncertain behavior of skin: Secondary | ICD-10-CM | POA: Diagnosis not present

## 2018-03-13 DIAGNOSIS — D3617 Benign neoplasm of peripheral nerves and autonomic nervous system of trunk, unspecified: Secondary | ICD-10-CM | POA: Diagnosis not present

## 2018-03-13 DIAGNOSIS — L821 Other seborrheic keratosis: Secondary | ICD-10-CM | POA: Diagnosis not present

## 2018-03-13 DIAGNOSIS — L111 Transient acantholytic dermatosis [Grover]: Secondary | ICD-10-CM | POA: Diagnosis not present

## 2018-03-19 ENCOUNTER — Other Ambulatory Visit: Payer: Self-pay | Admitting: Family Medicine

## 2018-03-19 DIAGNOSIS — I10 Essential (primary) hypertension: Secondary | ICD-10-CM

## 2018-04-06 ENCOUNTER — Other Ambulatory Visit: Payer: Self-pay | Admitting: Family Medicine

## 2018-04-06 DIAGNOSIS — I10 Essential (primary) hypertension: Secondary | ICD-10-CM

## 2018-05-16 ENCOUNTER — Other Ambulatory Visit: Payer: Self-pay | Admitting: Family Medicine

## 2018-05-25 ENCOUNTER — Other Ambulatory Visit: Payer: Self-pay | Admitting: Family Medicine

## 2018-05-25 DIAGNOSIS — K219 Gastro-esophageal reflux disease without esophagitis: Secondary | ICD-10-CM

## 2018-06-15 ENCOUNTER — Other Ambulatory Visit: Payer: Self-pay | Admitting: Family Medicine

## 2018-06-15 DIAGNOSIS — I10 Essential (primary) hypertension: Secondary | ICD-10-CM

## 2018-06-28 ENCOUNTER — Other Ambulatory Visit: Payer: Self-pay | Admitting: Family Medicine

## 2018-06-28 DIAGNOSIS — I10 Essential (primary) hypertension: Secondary | ICD-10-CM

## 2018-07-02 ENCOUNTER — Other Ambulatory Visit: Payer: Self-pay

## 2018-07-02 ENCOUNTER — Encounter: Payer: Self-pay | Admitting: Family Medicine

## 2018-07-02 ENCOUNTER — Ambulatory Visit (INDEPENDENT_AMBULATORY_CARE_PROVIDER_SITE_OTHER): Payer: Medicare HMO | Admitting: Family Medicine

## 2018-07-02 DIAGNOSIS — E785 Hyperlipidemia, unspecified: Secondary | ICD-10-CM | POA: Diagnosis not present

## 2018-07-02 DIAGNOSIS — K219 Gastro-esophageal reflux disease without esophagitis: Secondary | ICD-10-CM

## 2018-07-02 DIAGNOSIS — R7303 Prediabetes: Secondary | ICD-10-CM

## 2018-07-02 DIAGNOSIS — I10 Essential (primary) hypertension: Secondary | ICD-10-CM

## 2018-07-02 MED ORDER — PRAVASTATIN SODIUM 40 MG PO TABS: 40.0000 mg | ORAL_TABLET | Freq: Every day | ORAL | 3 refills | Status: DC

## 2018-07-02 MED ORDER — LISINOPRIL 40 MG PO TABS: 40.0000 mg | ORAL_TABLET | Freq: Every day | ORAL | 3 refills | Status: DC

## 2018-07-02 MED ORDER — OMEPRAZOLE 40 MG PO CPDR: 40.0000 mg | DELAYED_RELEASE_CAPSULE | Freq: Every day | ORAL | 3 refills | Status: DC

## 2018-07-02 MED ORDER — HYDROCHLOROTHIAZIDE 25 MG PO TABS: 25.0000 mg | ORAL_TABLET | Freq: Every day | ORAL | 3 refills | Status: DC

## 2018-07-02 NOTE — Progress Notes (Signed)
Virtual Visit via telephone Note  I connected with Johnathan [name] on 07/02/18 at 0931 by telephone and verified that I am speaking with the correct person using two identifiers. Johnathan [name] is currently located at home and no other people are currently with her during visit. The provider, Fransisca Kaufmann Dettinger, MD is located in their office at time of visit.  Call ended at [phone]  I discussed the limitations, risks, security and privacy concerns of performing an evaluation and management service by telephone and the availability of in person appointments. I also discussed with the patient that there may be a patient responsible charge related to this service. The patient expressed understanding and agreed to proceed.   History and Present Illness: Hypertension Patient is currently on lisinopril and hctz, and their blood pressure today is unknown, will check. Patient denies any lightheadedness or dizziness. Patient denies headaches, blurred vision, chest pains, shortness of breath, or weakness. Denies any side effects from medication and is content with current medication.   GERD Patient is currently on omeprazole.  She denies any major symptoms or abdominal pain or belching or burping. She denies any blood in her stool or lightheadedness or dizziness.   Hyperlipidemia Patient is coming in for recheck of his hyperlipidemia. The patient is currently taking pravastatin. They deny any issues with myalgias or history of liver damage from it. They deny any focal numbness or weakness or chest pain.   Prediabetes Patient comes in today for recheck of his diabetes. Patient has been currently taking no medication. Patient is currently on an ACE inhibitor/ARB. Patient has not seen an ophthalmologist this year. Patient denies any issues with their feet.   No diagnosis found.  Outpatient Encounter Medications as of 07/02/2018  Medication Sig  . Glucosamine-Chondroit-Vit C-Mn (GLUCOSAMINE 1500  COMPLEX PO) Take by mouth.  . hydrochlorothiazide (HYDRODIURIL) 25 MG tablet TAKE 1 TABLET BY MOUTH EVERY DAY  . lisinopril (ZESTRIL) 40 MG tablet TAKE 1 TABLET BY MOUTH EVERY DAY  . ofloxacin (FLOXIN OTIC) 0.3 % OTIC solution Place 5 drops into both ears daily.  Marland Kitchen omeprazole (PRILOSEC) 40 MG capsule TAKE 1 CAPSULE BY MOUTH EVERY DAY  . oxyCODONE (OXY IR/ROXICODONE) 5 MG immediate release tablet Take 1-2 tablets (5-10 mg total) by mouth every 4 (four) hours as needed for moderate pain or severe pain. (Patient taking differently: Take 5-10 mg by mouth as needed for moderate pain or severe pain. )  . pravastatin (PRAVACHOL) 40 MG tablet TAKE 1 TABLET BY MOUTH EVERY DAY  . sildenafil (REVATIO) 20 MG tablet TAKE 1-3 TABLETS (20-60 MG TOTAL) BY MOUTH AS NEEDED.   No facility-administered encounter medications on file as of 07/02/2018.     Review of Systems  Constitutional: Negative for chills and fever.  Eyes: Negative for visual disturbance.  Respiratory: Negative for shortness of breath and wheezing.   Cardiovascular: Negative for chest pain and leg swelling.  Musculoskeletal: Negative for back pain and gait problem.  Skin: Negative for rash.  Neurological: Negative for dizziness, weakness and light-headedness.  All other systems reviewed and are negative.   Observations/Objective: Patient sounds comfortable and in no acute distress  Assessment and Plan: Problem List Items Addressed This Visit      Cardiovascular and Mediastinum   Hypertension - Primary   Relevant Medications   pravastatin (PRAVACHOL) 40 MG tablet   lisinopril (ZESTRIL) 40 MG tablet   hydrochlorothiazide (HYDRODIURIL) 25 MG tablet     Digestive   GERD (gastroesophageal  reflux disease)   Relevant Medications   omeprazole (PRILOSEC) 40 MG capsule     Other   Hyperlipidemia LDL goal <130   Relevant Medications   pravastatin (PRAVACHOL) 40 MG tablet   lisinopril (ZESTRIL) 40 MG tablet   hydrochlorothiazide  (HYDRODIURIL) 25 MG tablet   Prediabetes       Follow Up Instructions: We will hold off on blood work today because of the coronavirus but will do it in sometime between 3 and 6 months, come back in 3 to 6 months  Continue current medication for now.   I discussed the assessment and treatment plan with the patient. The patient was provided an opportunity to ask questions and all were answered. The patient agreed with the plan and demonstrated an understanding of the instructions.   The patient was advised to call back or seek an in-person evaluation if the symptoms worsen or if the condition fails to improve as anticipated.  The above assessment and management plan was discussed with the patient. The patient verbalized understanding of and has agreed to the management plan. Patient is aware to call the clinic if symptoms persist or worsen. Patient is aware when to return to the clinic for a follow-up visit. Patient educated on when it is appropriate to go to the emergency department.    I provided 8 minutes of non-face-to-face time during this encounter.    Worthy Rancher, MD

## 2018-07-06 ENCOUNTER — Other Ambulatory Visit: Payer: Self-pay

## 2018-07-06 ENCOUNTER — Ambulatory Visit (INDEPENDENT_AMBULATORY_CARE_PROVIDER_SITE_OTHER): Payer: Medicare HMO | Admitting: *Deleted

## 2018-07-06 VITALS — Ht 68.0 in | Wt 205.4 lb

## 2018-07-06 DIAGNOSIS — Z0000 Encounter for general adult medical examination without abnormal findings: Secondary | ICD-10-CM

## 2018-07-06 NOTE — Patient Instructions (Signed)
Johnathan [name] , Thank you for taking time to come for your Medicare Wellness Visit. I appreciate your ongoing commitment to your health goals. Please review the following plan we discussed and let me know if I can assist you in the future.   These are the goals we discussed: Goals    . Exercise 3x per week (30 min per time)     Try to exercise for at least 30 min, 3 times weekly       This is a list of the screening recommended for you and due dates:  Health Maintenance  Topic Date Due  . Complete foot exam   07/08/1959  . Eye exam for diabetics  07/08/1959  . Tetanus Vaccine  07/07/1968  . Pneumonia vaccines (1 of 2 - PCV13) 07/08/2014  . Hemoglobin A1C  06/30/2018  . Flu Shot  08/11/2018  . Colon Cancer Screening  08/16/2023  .  Hepatitis C: One time screening is recommended by Center for Disease Control  (CDC) for  adults born from [birth year] through 1966.   Completed     Advance Directive  Advance directives are legal documents that let you make choices ahead of time about your health care and medical treatment in case you become unable to communicate for yourself. Advance directives are a way for you to communicate your wishes to family, friends, and health care providers. This can help convey your decisions about end-of-life care if you become unable to communicate. Discussing and writing advance directives should happen over time rather than all at once. Advance directives can be changed depending on your situation and what you want, even after you have signed the advance directives. If you do not have an advance directive, some states assign family decision makers to act on your behalf based on how closely you are related to them. Each state has its own laws regarding advance directives. You may want to check with your health care provider, attorney, or state representative about the laws in your state. There are different types of advance directives, such as:  Medical power of  attorney.  Living will.  Do not resuscitate (DNR) or do not attempt resuscitation (DNAR) order. Health care proxy and medical power of attorney A health care proxy, also called a health care agent, is a person who is appointed to make medical decisions for you in cases in which you are unable to make the decisions yourself. Generally, people choose someone they know well and trust to represent their preferences. Make sure to ask this person for an agreement to act as your proxy. A proxy may have to exercise judgment in the event of a medical decision for which your wishes are not known. A medical power of attorney is a legal document that names your health care proxy. Depending on the laws in your state, after the document is written, it may also need to be:  Signed.  Notarized.  Dated.  Copied.  Witnessed.  Incorporated into your medical record. You may also want to appoint someone to manage your financial affairs in a situation in which you are unable to do so. This is called a durable power of attorney for finances. It is a separate legal document from the durable power of attorney for health care. You may choose the same person or someone different from your health care proxy to act as your agent in financial matters. If you do not appoint a proxy, or if there is a concern that the proxy  is not acting in your best interests, a court-appointed guardian may be designated to act on your behalf. Living will A living will is a set of instructions documenting your wishes about medical care when you cannot express them yourself. Health care providers should keep a copy of your living will in your medical record. You may want to give a copy to family members or friends. To alert caregivers in case of an emergency, you can place a card in your wallet to let them know that you have a living will and where they can find it. A living will is used if you become:  Terminally ill.  Incapacitated.   Unable to communicate or make decisions. Items to consider in your living will include:  The use or non-use of life-sustaining equipment, such as dialysis machines and breathing machines (ventilators).  A DNR or DNAR order, which is the instruction not to use cardiopulmonary resuscitation (CPR) if breathing or heartbeat stops.  The use or non-use of tube feeding.  Withholding of food and fluids.  Comfort (palliative) care when the goal becomes comfort rather than a cure.  Organ and tissue donation. A living will does not give instructions for distributing your money and property if you should pass away. It is recommended that you seek the advice of a lawyer when writing a will. Decisions about taxes, beneficiaries, and asset distribution will be legally binding. This process can relieve your family and friends of any concerns surrounding disputes or questions that may come up about the distribution of your assets. DNR or DNAR A DNR or DNAR order is a request not to have CPR in the event that your heart stops beating or you stop breathing. If a DNR or DNAR order has not been made and shared, a health care provider will try to help any patient whose heart has stopped or who has stopped breathing. If you plan to have surgery, talk with your health care provider about how your DNR or DNAR order will be followed if problems occur. Summary  Advance directives are the legal documents that allow you to make choices ahead of time about your health care and medical treatment in case you become unable to communicate for yourself.  The process of discussing and writing advance directives should happen over time. You can change the advance directives, even after you have signed them.  Advance directives include DNR or DNAR orders, living wills, and designating an agent as your medical power of attorney. This information is not intended to replace advice given to you by your health care provider. Make sure  you discuss any questions you have with your health care provider. Document Released: 04/05/2007 Document Revised: 11/16/2015 Document Reviewed: 11/16/2015 Elsevier Interactive Patient Education  2019 [employer].

## 2018-07-06 NOTE — Progress Notes (Signed)
MEDICARE ANNUAL WELLNESS VISIT  07/06/2018  Telephone Visit Disclaimer This Medicare AWV was conducted by telephone due to national recommendations for restrictions regarding the COVID-19 Pandemic (e.g. social distancing).  I verified, using two identifiers, that I am speaking with Johnathan [name] or their authorized healthcare agent. I discussed the limitations, risks, security, and privacy concerns of performing an evaluation and management service by telephone and the potential availability of an in-person appointment in the future. The patient expressed understanding and agreed to proceed.   Subjective:  Johnathan [name] is a 68 y.o. male patient of Dettinger, Fransisca Kaufmann, MD who had a Medicare Annual Wellness Visit today via telephone. Johnathan [name] is Retired and lives with their spouse. he has 2 children. he reports that he is not socially active and does interact with friends/family regularly. he is minimally physically active and enjoys fishing.  Patient Care Team: Dettinger, Fransisca Kaufmann, MD as PCP - General (Family Medicine)  Advanced Directives 07/06/2018 12/28/2015 12/22/2015  Does Patient Have a Medical Advance Directive? No Yes Yes  Type of Advance Directive - Living will Living will  Does patient want to make changes to medical advance directive? - No - Patient declined -  Would patient like information on creating a medical advance directive? Yes (MAU/Ambulatory/Procedural Areas - Information given) - [facility] Utilization Over the Past 12 Months: # of hospitalizations or ER visits: 0 # of surgeries: 0  Review of Systems    Patient reports that his overall health is unchanged compared to last year.  Patient Reported Readings (BP, Pulse, CBG, Weight, etc) CBG 149  Review of Systems: History obtained from chart review and the patient General ROS: negative  All other systems negative.  Pain Assessment Pain : No/denies pain     Current Medications & Allergies  (verified) Allergies as of 07/06/2018   No Known Allergies     Medication List       Accurate as of July 06, 2018  9:59 AM. If you have any questions, ask your nurse or doctor.        GLUCOSAMINE 1500 COMPLEX PO Take by mouth.   hydrochlorothiazide 25 MG tablet Commonly known as: HYDRODIURIL Take 1 tablet (25 mg total) by mouth daily.   lisinopril 40 MG tablet Commonly known as: ZESTRIL Take 1 tablet (40 mg total) by mouth daily.   omeprazole 40 MG capsule Commonly known as: PRILOSEC Take 1 capsule (40 mg total) by mouth daily.   pravastatin 40 MG tablet Commonly known as: PRAVACHOL Take 1 tablet (40 mg total) by mouth daily.   sildenafil 20 MG tablet Commonly known as: REVATIO TAKE 1-3 TABLETS (20-60 MG TOTAL) BY MOUTH AS NEEDED.       History (reviewed): Past Medical History:  Diagnosis Date  . GERD (gastroesophageal reflux disease)   . Hyperlipidemia   . Hypertension    Past Surgical History:  Procedure Laterality Date  . CATARACT EXTRACTION    . EXCISION OF SKIN TAG N/A 12/28/2015   Procedure: EXCISION OF ANAL  SKIN TAGS AND 3 SKIN TAGS LEFT THIGH;  Surgeon: Georganna Skeans, MD;  Location: [place];  Service: General;  Laterality: N/A;  . HEMORRHOID SURGERY  12/28/2015   Procedure: HEMORRHOIDECTOMY x2;  Surgeon: Georganna Skeans, MD;  Location: [place];  Service: General;;  . POLYPECTOMY     from vocal cords   Family History  Problem Relation Age of Onset  . Cancer Mother  breast  . Hepatitis C Brother    Social History   Socioeconomic History  . Marital status: Married    Spouse name: Not on file  . Number of children: 2  . Years of education: Not on file  . Highest education level: 12th grade  Occupational History  . Occupation: Retired  Scientific laboratory technician  . Financial resource strain: Not hard at all  . Food insecurity    Worry: Never true    Inability: Never true  . Transportation needs    Medical: No     Non-medical: No  Tobacco Use  . Smoking status: Never Smoker  . Smokeless tobacco: Never Used  Substance and Sexual Activity  . Alcohol use: No  . Drug use: No  . Sexual activity: Yes  Lifestyle  . Physical activity    Days per week: 0 days    Minutes per session: 0 min  . Stress: Not at all  Relationships  . Social Herbalist on phone: Three times a week    Gets together: More than three times a week    Attends religious service: More than 4 times per year    Active member of club or organization: Yes    Attends meetings of clubs or organizations: More than 4 times per year    Relationship status: Married  Other Topics Concern  . Not on file  Social History Narrative  . Not on file    Activities of Daily Living In your present state of health, do you have any difficulty performing the following activities: 07/06/2018  Hearing? Y  Comment Wears Hearing Aids  Vision? Y  Comment Wears glasses  Difficulty concentrating or making decisions? Y  Walking or climbing stairs? N  Dressing or bathing? N  Doing errands, shopping? N  Preparing Food and eating ? N  Using the Toilet? N  In the past six months, have you accidently leaked urine? N  Do you have problems with loss of bowel control? N  Managing your Medications? N  Managing your Finances? N  Housekeeping or managing your Housekeeping? N  Some recent data might be hidden    Patient Literacy How often do you need to have someone help you when you read instructions, pamphlets, or other written materials from your doctor or pharmacy?: 1 - Never  Exercise Current Exercise Habits: The patient does not participate in regular exercise at present, Exercise limited by: None identified  Diet Patient reports consuming 3 meals a day and 0 snack(s) a day Patient reports that his primary diet is: Regular Patient reports that she does have regular access to food.   Depression Screen PHQ 2/9 Scores 07/06/2018  10/16/2017 12/28/2016 06/27/2016 03/04/2015  PHQ - 2 Score 0 0 0 0 0     Fall Risk Fall Risk  07/06/2018 10/16/2017 01/19/2017 12/28/2016 06/27/2016  Falls in the past year? 0 No No No No  Number falls in past yr: 0 - - - -  Injury with Fall? 0 - - - -  Risk for fall due to : - - - - History of fall(s)     Objective:  Johnathan [name] seemed alert and oriented and he participated appropriately during our telephone visit.  Blood Pressure Weight BMI  BP Readings from Last 3 Encounters:  12/29/17 139/82  10/24/17 121/76  10/16/17 119/76   Wt Readings from Last 3 Encounters:  07/06/18 205 lb 6.4 oz (93.2 kg)  12/29/17 205 lb 6.4 oz (  93.2 kg)  10/24/17 197 lb 12.8 oz (89.7 kg)   BMI Readings from Last 1 Encounters:  07/06/18 31.23 kg/m    *Unable to obtain current vital signs, weight, and BMI due to telephone visit type  Hearing/Vision  . Johnathan [name] did  seem to have difficulty with hearing/understanding during the telephone conversation . Reports that he has had a formal eye exam by an eye care professional within the past year . Reports that he has had a formal hearing evaluation within the past year *Unable to fully assess hearing and vision during telephone visit type  Cognitive Function: 6CIT Screen 07/06/2018  What Year? 0 points  What month? 0 points  What time? 0 points  Count back from 20 0 points  Months in reverse 0 points  Repeat phrase 0 points  Total Score 0   (Normal:0-7, Significant for Dysfunction: >8)  Normal Cognitive Function Screening: Yes   Immunization & Health Maintenance Record Immunization History  Administered Date(s) Administered  . DTaP 01/11/2003  . Influenza-Unspecified 10/16/2000    Health Maintenance  Topic Date Due  . FOOT EXAM  07/08/1960  . OPHTHALMOLOGY EXAM  07/08/1959  . TETANUS/TDAP  07/07/1968  . PNA vac Low Risk Adult (1 of 2 - PCV13) 07/08/2014  . HEMOGLOBIN A1C  06/30/2018  . INFLUENZA VACCINE  08/11/2018  . COLONOSCOPY   08/16/2023  . Hepatitis C Screening  Completed       Assessment  This is a routine wellness examination for Johnathan [name].  Health Maintenance: Due or Overdue Health Maintenance Due  Topic Date Due  . FOOT EXAM  07/08/1960  . OPHTHALMOLOGY EXAM  07/08/1959  . TETANUS/TDAP  07/07/1968  . PNA vac Low Risk Adult (1 of 2 - PCV13) 07/08/2014  . HEMOGLOBIN A1C  06/30/2018    Johnathan [name] does not need a referral for Community Assistance: Care Management:   no Social Work:    no Prescription Assistance:  no Nutrition/Diabetes Education:  no   Plan:  Personalized Goals Goals Addressed            This Visit's Progress   . Exercise 3x per week (30 min per time)       Try to exercise for at least 30 min, 3 times weekly      Personalized Health Maintenance & Screening Recommendations  Pneumococcal vaccine  Td vaccine Diabetes screening Glaucoma screening Shingrix  Lung Cancer Screening Recommended: no (Low Dose CT Chest recommended if Age 43-80 years, 30 pack-year currently smoking OR have quit w/in past 15 years) Hepatitis C Screening recommended: no HIV Screening recommended: no  Advanced Directives: Written information was prepared per patient's request.  Referrals & Orders No orders of the defined types were placed in this encounter.   Follow-up Plan . Follow-up with Dettinger, Fransisca Kaufmann, MD as planned    I have personally reviewed and noted the following in the patient's chart:   . Medical and social history . Use of alcohol, tobacco or illicit drugs  . Current medications and supplements . Functional ability and status . Nutritional status . Physical activity . Advanced directives . List of other physicians . Hospitalizations, surgeries, and ER visits in previous 12 months . Vitals . Screenings to include cognitive, depression, and falls . Referrals and appointments  In addition, I have reviewed and discussed with Johnathan [name] certain  preventive protocols, quality metrics, and best practice recommendations. A written personalized care plan for preventive services as well as general preventive  health recommendations is available and can be mailed to the patient at his request.      Wardell Heath, LPN  [id number]/41/[id number]

## 2018-07-12 DIAGNOSIS — Z1159 Encounter for screening for other viral diseases: Secondary | ICD-10-CM | POA: Diagnosis not present

## 2018-08-20 ENCOUNTER — Other Ambulatory Visit: Payer: Self-pay | Admitting: Family Medicine

## 2018-08-20 NOTE — Telephone Encounter (Signed)
Please call to review request for diabetic supplies.(not on any diabetic medications and normal last A1C)

## 2018-08-30 DIAGNOSIS — R69 Illness, unspecified: Secondary | ICD-10-CM | POA: Diagnosis not present

## 2018-08-30 NOTE — Telephone Encounter (Signed)
Multiple attempts made to contact patient.  This encounter will now be closed  

## 2018-09-11 ENCOUNTER — Telehealth: Payer: Self-pay | Admitting: Family Medicine

## 2018-09-11 DIAGNOSIS — L03116 Cellulitis of left lower limb: Secondary | ICD-10-CM | POA: Diagnosis not present

## 2018-09-11 DIAGNOSIS — W57XXXA Bitten or stung by nonvenomous insect and other nonvenomous arthropods, initial encounter: Secondary | ICD-10-CM | POA: Diagnosis not present

## 2018-09-11 DIAGNOSIS — S80862A Insect bite (nonvenomous), left lower leg, initial encounter: Secondary | ICD-10-CM | POA: Diagnosis not present

## 2018-09-11 NOTE — Telephone Encounter (Signed)
Pt has insect bite with redness and purulent drainage Wanted appt for today No available appts  Pt urged to go to Urgent Care

## 2018-10-02 ENCOUNTER — Telehealth: Payer: Self-pay | Admitting: Family Medicine

## 2018-10-02 ENCOUNTER — Other Ambulatory Visit: Payer: Self-pay

## 2018-10-02 ENCOUNTER — Encounter: Payer: Self-pay | Admitting: Family Medicine

## 2018-10-02 ENCOUNTER — Ambulatory Visit (INDEPENDENT_AMBULATORY_CARE_PROVIDER_SITE_OTHER): Payer: Medicare HMO | Admitting: Family Medicine

## 2018-10-02 DIAGNOSIS — M791 Myalgia, unspecified site: Secondary | ICD-10-CM | POA: Diagnosis not present

## 2018-10-02 DIAGNOSIS — Z20822 Contact with and (suspected) exposure to covid-19: Secondary | ICD-10-CM

## 2018-10-02 DIAGNOSIS — Z20828 Contact with and (suspected) exposure to other viral communicable diseases: Secondary | ICD-10-CM | POA: Diagnosis not present

## 2018-10-02 DIAGNOSIS — R6889 Other general symptoms and signs: Secondary | ICD-10-CM

## 2018-10-02 DIAGNOSIS — R05 Cough: Secondary | ICD-10-CM | POA: Diagnosis not present

## 2018-10-02 DIAGNOSIS — R059 Cough, unspecified: Secondary | ICD-10-CM

## 2018-10-02 MED ORDER — GUAIFENESIN-CODEINE 100-10 MG/5ML PO SOLN: 5.0000 mL | Freq: Four times a day (QID) | ORAL | 0 refills | Status: DC | PRN

## 2018-10-02 MED ORDER — HYDROCODONE-HOMATROPINE 5-1.5 MG/5ML PO SYRP: 5.0000 mL | ORAL_SOLUTION | Freq: Three times a day (TID) | ORAL | 0 refills | Status: DC | PRN

## 2018-10-02 NOTE — Telephone Encounter (Signed)
Patient aware.

## 2018-10-02 NOTE — Patient Instructions (Signed)
Prevent the Spread of COVID-19 if You Are Sick If you are sick with COVID-19 or think you might have COVID-19, follow the steps below to help protect other people in your home and community. Stay home except to get medical care.  Stay home. Most people with COVID-19 have mild illness and are able to recover at home without medical care. Do not leave your home, except to get medical care. Do not visit public areas.  Take care of yourself. Get rest and stay hydrated.  Get medical care when needed. Call your doctor before you go to their office for care. But, if you have trouble breathing or other concerning symptoms, call 911 for immediate help.  Avoid public transportation, ride-sharing, or taxis. Separate yourself from other people and pets in your home.  As much as possible, stay in a specific room and away from other people and pets in your home. Also, you should use a separate bathroom, if available. If you need to be around other people or animals in or outside of the home, wear a cloth face covering. ? See COVID-19 and Animals if you have questions about pets: https://www.cdc.gov/coronavirus/2019-ncov/faq.html#COVID19animals Monitor your symptoms.  Common symptoms of COVID-19 include fever and cough. Trouble breathing is a more serious symptom that means you should get medical attention.  Follow care instructions from your healthcare provider and local health department. Your local health authorities will give instructions on checking your symptoms and reporting information. If you develop emergency warning signs for COVID-19 get medical attention immediately.  Emergency warning signs include*:  Trouble breathing  Persistent pain or pressure in the chest  New confusion or not able to be woken  Bluish lips or face *This list is not all inclusive. Please consult your medical provider for any other symptoms that are severe or concerning to you. Call 911 if you have a medical  emergency. If you have a medical emergency and need to call 911, notify the operator that you have or think you might have, COVID-19. If possible, put on a facemask before medical help arrives. Call ahead before visiting your doctor.  Call ahead. Many medical visits for routine care are being postponed or done by phone or telemedicine.  If you have a medical appointment that cannot be postponed, call your doctor's office. This will help the office protect themselves and other patients. If you are sick, wear a cloth covering over your nose and mouth.  You should wear a cloth face covering over your nose and mouth if you must be around other people or animals, including pets (even at home).  You don't need to wear the cloth face covering if you are alone. If you can't put on a cloth face covering (because of trouble breathing for example), cover your coughs and sneezes in some other way. Try to stay at least 6 feet away from other people. This will help protect the people around you. Note: During the COVID-19 pandemic, medical grade facemasks are reserved for healthcare workers and some first responders. You may need to make a cloth face covering using a scarf or bandana. Cover your coughs and sneezes.  Cover your mouth and nose with a tissue when you cough or sneeze.  Throw used tissues in a lined trash can.  Immediately wash your hands with soap and water for at least 20 seconds. If soap and water are not available, clean your hands with an alcohol-based hand sanitizer that contains at least 60% alcohol. Clean your hands often.    Wash your hands often with soap and water for at least 20 seconds. This is especially important after blowing your nose, coughing, or sneezing; going to the bathroom; and before eating or preparing food.  Use hand sanitizer if soap and water are not available. Use an alcohol-based hand sanitizer with at least 60% alcohol, covering all surfaces of your hands and rubbing  them together until they feel dry.  Soap and water are the best option, especially if your hands are visibly dirty.  Avoid touching your eyes, nose, and mouth with unwashed hands. Avoid sharing personal household items.  Do not share dishes, drinking glasses, cups, eating utensils, towels, or bedding with other people in your home.  Wash these items thoroughly after using them with soap and water or put them in the dishwasher. Clean all "high-touch" surfaces everyday.  Clean and disinfect high-touch surfaces in your "sick room" and bathroom. Let someone else clean and disinfect surfaces in common areas, but not your bedroom and bathroom.  If a caregiver or other person needs to clean and disinfect a sick person's bedroom or bathroom, they should do so on an as-needed basis. The caregiver/other person should wear a mask and wait as long as possible after the sick person has used the bathroom. High-touch surfaces include phones, remote controls, counters, tabletops, doorknobs, bathroom fixtures, toilets, keyboards, tablets, and bedside tables.  Clean and disinfect areas that may have blood, stool, or body fluids on them.  Use household cleaners and disinfectants. Clean the area or item with soap and water or another detergent if it is dirty. Then use a household disinfectant. ? Be sure to follow the instructions on the label to ensure safe and effective use of the product. Many products recommend keeping the surface wet for several minutes to ensure germs are killed. Many also recommend precautions such as wearing gloves and making sure you have good ventilation during use of the product. ? Most EPA-registered household disinfectants should be effective. How to discontinue home isolation  People with COVID-19 who have stayed home (home isolated) can stop home isolation under the following conditions: ? If you will not have a test to determine if you are still contagious, you can leave home  after these three things have happened:  You have had no fever for at least 72 hours (that is three full days of no fever without the use of medicine that reduces fevers) AND  other symptoms have improved (for example, when your cough or shortness of breath has improved) AND  at least 10 days have passed since your symptoms first appeared. ? If you will be tested to determine if you are still contagious, you can leave home after these three things have happened:  You no longer have a fever (without the use of medicine that reduces fevers) AND  other symptoms have improved (for example, when your cough or shortness of breath has improved) AND  you received two negative tests in a row, 24 hours apart. Your doctor will follow CDC guidelines. In all cases, follow the guidance of your healthcare provider and local health department. The decision to stop home isolation should be made in consultation with your healthcare provider and state and local health departments. Local decisions depend on local circumstances. cdc.gov/coronavirus 05/13/2018 This information is not intended to replace advice given to you by your health care provider. Make sure you discuss any questions you have with your health care provider. Document Released: 04/24/2018 Document Revised: 05/23/2018 Document Reviewed: 04/24/2018   Elsevier Patient Education  2020 [employer]  

## 2018-10-02 NOTE — Addendum Note (Signed)
Addended by: Janora Norlander on: 10/02/2018 03:38 PM   Modules accepted: Orders

## 2018-10-02 NOTE — Telephone Encounter (Signed)
It does not appear that any of the cough medications are covered.  I will send in Hycodan to replace but may not necessarily be cheaper.  If not covered, please have patient call insurance to see what is on formulary.

## 2018-10-02 NOTE — Progress Notes (Signed)
Telephone visit  Subjective: CC: cough, sore throat PCP: Dettinger, Fransisca Kaufmann, MD [id number] Johnathan [name] is a 68 y.o. male calls for telephone consult today. Patient provides verbal consent for consult held via phone.  Location of patient: home Location of provider: Working remotely from home Others present for call: His wife, Johnathan [name]  1. Cough, sore throat Patient reports that he started having myalgia and dry cough over the last few days.  Denies shortness of breath or wheeze.  Denies nausea, vomiting, diarrhea.  Denies fever.  There are 3 patrons in the church that [employer].  They have been around their children and grandchild, who is staying with them for school purposes.  His wife has a chronic cough but nothing changed from baseline.  ROS: Per HPI  No Known Allergies Past Medical History:  Diagnosis Date  . GERD (gastroesophageal reflux disease)   . Hyperlipidemia   . Hypertension     Current Outpatient Medications:  Marland Kitchen  Glucosamine-Chondroit-Vit C-Mn (GLUCOSAMINE 1500 COMPLEX PO), Take by mouth., Disp: , Rfl:  .  hydrochlorothiazide (HYDRODIURIL) 25 MG tablet, Take 1 tablet (25 mg total) by mouth daily., Disp: 90 tablet, Rfl: 3 .  lisinopril (ZESTRIL) 40 MG tablet, Take 1 tablet (40 mg total) by mouth daily., Disp: 90 tablet, Rfl: 3 .  omeprazole (PRILOSEC) 40 MG capsule, Take 1 capsule (40 mg total) by mouth daily., Disp: 90 capsule, Rfl: 3 .  pravastatin (PRAVACHOL) 40 MG tablet, Take 1 tablet (40 mg total) by mouth daily., Disp: 90 tablet, Rfl: 3 .  sildenafil (REVATIO) 20 MG tablet, TAKE 1-3 TABLETS (20-60 MG TOTAL) BY MOUTH AS NEEDED., Disp: 20 tablet, Rfl: 1  Assessment/ Plan: 68 y.o. male   1. Suspected Covid-19 Virus Infection I ordered testing for the patient.  We discussed hydration, rest and isolation.  We will treat cough with Hycodan syrup.  We discussed red flag signs and symptoms warranting further evaluation.  Both he and his wife voiced good understanding. -  Novel Coronavirus, NAA ([facility]) - guaiFENesin-codeine 100-10 MG/5ML syrup; Take 5 mLs by mouth every 6 (six) hours as needed for cough.  Dispense: 120 mL; Refill: 0  2. Cough - guaiFENesin-codeine 100-10 MG/5ML syrup; Take 5 mLs by mouth every 6 (six) hours as needed for cough.  Dispense: 120 mL; Refill: 0  3. Myalgia - guaiFENesin-codeine 100-10 MG/5ML syrup; Take 5 mLs by mouth every 6 (six) hours as needed for cough.  Dispense: 120 mL; Refill: 0  4. Exposure to Covid-19 Virus - Novel Coronavirus, NAA ([facility])  The Narcotic Database has been reviewed.  There were no red flags.     Start time: 2:09pm End time: 2:18pm  Total time spent on patient care (including telephone call/ virtual visit): 16 minutes  [place], [place] [phone]

## 2018-10-04 LAB — NOVEL CORONAVIRUS, NAA: SARS-CoV-2, NAA: DETECTED — AB

## 2018-10-11 ENCOUNTER — Ambulatory Visit (INDEPENDENT_AMBULATORY_CARE_PROVIDER_SITE_OTHER): Payer: Medicare HMO | Admitting: Family Medicine

## 2018-10-11 ENCOUNTER — Encounter: Payer: Self-pay | Admitting: Family Medicine

## 2018-10-11 DIAGNOSIS — U071 COVID-19: Secondary | ICD-10-CM | POA: Diagnosis not present

## 2018-10-11 NOTE — Progress Notes (Signed)
Virtual Visit via telephone Note Due to COVID-19 pandemic this visit was conducted virtually. This visit type was conducted due to national recommendations for restrictions regarding the COVID-19 Pandemic (e.g. social distancing, sheltering in place) in an effort to limit this patient's exposure and mitigate transmission in our community. All issues noted in this document were discussed and addressed.  A physical exam was not performed with this format.   I connected with Johnathan [name] on 10/11/18 at 1205 by telephone and verified that I am speaking with the correct person using two identifiers. Johnathan [name] is currently located at home and family is currently with them during visit. The provider, Monia Pouch, FNP is located in their office at time of visit.  I discussed the limitations, risks, security and privacy concerns of performing an evaluation and management service by telephone and the availability of in person appointments. I also discussed with the patient that there may be a patient responsible charge related to this service. The patient expressed understanding and agreed to proceed.  Subjective:  Patient ID: Johnathan [name], male    DOB: 07/12/1950, 68 y.o.   MRN: [id number]  Chief Complaint:  Diarrhea and URI   HPI: Johnathan [name] is a 68 y.o. male presenting on 10/11/2018 for Diarrhea and URI   Pt has known COVID-19. Pt states he continues to feel bad. Slight URI symptoms along with diarrhea for the last few days. Is concerned about length of symptoms and what to take to manage the symptoms. He is eating and drinking well. Has been urinating at least 3 times per day. He has been taking Zinc, ASA, and a cough syrup with some relief. He has not taken any antidiarrheal medications.     Relevant past medical, surgical, family, and social history reviewed and updated as indicated.  Allergies and medications reviewed and updated.   Past Medical History:  Diagnosis  Date  . GERD (gastroesophageal reflux disease)   . Hyperlipidemia   . Hypertension     Past Surgical History:  Procedure Laterality Date  . CATARACT EXTRACTION    . EXCISION OF SKIN TAG N/A 12/28/2015   Procedure: EXCISION OF ANAL  SKIN TAGS AND 3 SKIN TAGS LEFT THIGH;  Surgeon: Georganna Skeans, MD;  Location: [facility];  Service: General;  Laterality: N/A;  . HEMORRHOID SURGERY  12/28/2015   Procedure: HEMORRHOIDECTOMY x2;  Surgeon: Georganna Skeans, MD;  Location: [facility];  Service: General;;  . POLYPECTOMY     from vocal cords    Social History   Socioeconomic History  . Marital status: Married    Spouse name: Not on file  . Number of children: 2  . Years of education: Not on file  . Highest education level: 12th grade  Occupational History  . Occupation: Retired  Scientific laboratory technician  . Financial resource strain: Not hard at all  . Food insecurity    Worry: Never true    Inability: Never true  . Transportation needs    Medical: No    Non-medical: No  Tobacco Use  . Smoking status: Never Smoker  . Smokeless tobacco: Never Used  Substance and Sexual Activity  . Alcohol use: No  . Drug use: No  . Sexual activity: Yes  Lifestyle  . Physical activity    Days per week: 0 days    Minutes per session: 0 min  . Stress: Not at all  Relationships  . Social connections  Talks on phone: Three times a week    Gets together: More than three times a week    Attends religious service: More than 4 times per year    Active member of club or organization: Yes    Attends meetings of clubs or organizations: More than 4 times per year    Relationship status: Married  . Intimate partner violence    Fear of current or ex partner: No    Emotionally abused: No    Physically abused: No    Forced sexual activity: No  Other Topics Concern  . Not on file  Social History Narrative  . Not on file    Outpatient Encounter Medications as of 10/11/2018   Medication Sig  . Glucosamine-Chondroit-Vit C-Mn (GLUCOSAMINE 1500 COMPLEX PO) Take by mouth.  . hydrochlorothiazide (HYDRODIURIL) 25 MG tablet Take 1 tablet (25 mg total) by mouth daily.  Marland Kitchen HYDROcodone-homatropine (HYCODAN) 5-1.5 MG/5ML syrup Take 5 mLs by mouth every 8 (eight) hours as needed for cough. Please cancel previous Rx for Robitussin AC  . lisinopril (ZESTRIL) 40 MG tablet Take 1 tablet (40 mg total) by mouth daily.  Marland Kitchen omeprazole (PRILOSEC) 40 MG capsule Take 1 capsule (40 mg total) by mouth daily.  . pravastatin (PRAVACHOL) 40 MG tablet Take 1 tablet (40 mg total) by mouth daily.  . sildenafil (REVATIO) 20 MG tablet TAKE 1-3 TABLETS (20-60 MG TOTAL) BY MOUTH AS NEEDED.   No facility-administered encounter medications on file as of 10/11/2018.     No Known Allergies  Review of Systems  Constitutional: Positive for activity change, chills, fatigue and fever. Negative for appetite change, diaphoresis and unexpected weight change.  HENT: Positive for congestion, rhinorrhea and sore throat.   Eyes: Negative.  Negative for photophobia and visual disturbance.  Respiratory: Positive for cough. Negative for chest tightness, shortness of breath and wheezing.   Cardiovascular: Negative for chest pain, palpitations and leg swelling.  Gastrointestinal: Positive for abdominal pain and diarrhea. Negative for anal bleeding, blood in stool, constipation, nausea, rectal pain and vomiting.  Endocrine: Negative.   Genitourinary: Negative for decreased urine volume, difficulty urinating, dysuria, frequency and urgency.  Musculoskeletal: Positive for myalgias. Negative for arthralgias.  Skin: Negative.  Negative for color change and pallor.  Allergic/Immunologic: Negative.   Neurological: Positive for weakness (generalized). Negative for dizziness and headaches.  Hematological: Negative.   Psychiatric/Behavioral: Negative for confusion, hallucinations, sleep disturbance and suicidal ideas.  All  other systems reviewed and are negative.        Observations/Objective: No vital signs or physical exam, this was a telephone or virtual health encounter.  Pt alert and oriented, answers all questions appropriately, and able to speak in full sentences.    Assessment and Plan: Robertson was seen today for diarrhea and uri.  Diagnoses and all orders for this visit:  COVID-19 virus infection Known COVID-19 infection. Symptomatic care discussed in detail. Can take over the counter antidiarrheal medications if needed. Stay hydrated. If symptoms persist or worsen, may need to be evaluated and treated in the ED or at an urgent care.     Follow Up Instructions: Return if symptoms worsen or fail to improve.    I discussed the assessment and treatment plan with the patient. The patient was provided an opportunity to ask questions and all were answered. The patient agreed with the plan and demonstrated an understanding of the instructions.   The patient was advised to call back or seek an in-person evaluation if the symptoms  worsen or if the condition fails to improve as anticipated.  The above assessment and management plan was discussed with the patient. The patient verbalized understanding of and has agreed to the management plan. Patient is aware to call the clinic if they develop any new symptoms or if symptoms persist or worsen. Patient is aware when to return to the clinic for a follow-up visit. Patient educated on when it is appropriate to go to the emergency department.    I provided 15 minutes of non-face-to-face time during this encounter. The call started at 1205. The call ended at 1220. The other time was used for coordination of care.    Monia Pouch, FNP-C [facility] [address] [place], [place] [address] [phone] 10/11/18

## 2018-11-22 ENCOUNTER — Ambulatory Visit (INDEPENDENT_AMBULATORY_CARE_PROVIDER_SITE_OTHER): Payer: Medicare HMO

## 2018-11-22 ENCOUNTER — Encounter: Payer: Self-pay | Admitting: Nurse Practitioner

## 2018-11-22 ENCOUNTER — Other Ambulatory Visit: Payer: Self-pay

## 2018-11-22 ENCOUNTER — Ambulatory Visit (INDEPENDENT_AMBULATORY_CARE_PROVIDER_SITE_OTHER): Payer: Medicare HMO | Admitting: Nurse Practitioner

## 2018-11-22 VITALS — BP 129/83 | HR 115 | Temp 97.6°F | Ht 68.0 in | Wt 208.4 lb

## 2018-11-22 DIAGNOSIS — W19XXXA Unspecified fall, initial encounter: Secondary | ICD-10-CM

## 2018-11-22 DIAGNOSIS — M546 Pain in thoracic spine: Secondary | ICD-10-CM | POA: Diagnosis not present

## 2018-11-22 DIAGNOSIS — S299XXA Unspecified injury of thorax, initial encounter: Secondary | ICD-10-CM | POA: Diagnosis not present

## 2018-11-22 MED ORDER — CYCLOBENZAPRINE HCL 10 MG PO TABS: 10.0000 mg | ORAL_TABLET | Freq: Three times a day (TID) | ORAL | 1 refills | Status: DC | PRN

## 2018-11-22 MED ORDER — NAPROXEN 500 MG PO TABS: 500.0000 mg | ORAL_TABLET | Freq: Two times a day (BID) | ORAL | 1 refills | Status: DC

## 2018-11-22 NOTE — Patient Instructions (Signed)
Acute Back Pain, Adult Acute back pain is sudden and usually short-lived. It is often caused by an injury to the muscles and tissues in the back. The injury may result from:  A muscle or ligament getting overstretched or torn (strained). Ligaments are tissues that connect bones to each other. Lifting something improperly can cause a back strain.  Wear and tear (degeneration) of the spinal disks. Spinal disks are circular tissue that provides cushioning between the bones of the spine (vertebrae).  Twisting motions, such as while playing sports or doing yard work.  A hit to the back.  Arthritis. You may have a physical exam, lab tests, and imaging tests to find the cause of your pain. Acute back pain usually goes away with rest and home care. Follow these instructions at home: Managing pain, stiffness, and swelling  Take over-the-counter and prescription medicines only as told by your health care provider.  Your health care provider may recommend applying ice during the first 24-48 hours after your pain starts. To do this: ? Put ice in a plastic bag. ? Place a towel between your skin and the bag. ? Leave the ice on for 20 minutes, 2-3 times a day.  If directed, apply heat to the affected area as often as told by your health care provider. Use the heat source that your health care provider recommends, such as a moist heat pack or a heating pad. ? Place a towel between your skin and the heat source. ? Leave the heat on for 20-30 minutes. ? Remove the heat if your skin turns bright red. This is especially important if you are unable to feel pain, heat, or cold. You have a greater risk of getting burned. Activity   Do not stay in bed. Staying in bed for more than 1-2 days can delay your recovery.  Sit up and stand up straight. Avoid leaning forward when you sit, or hunching over when you stand. ? If you work at a desk, sit close to it so you do not need to lean over. Keep your chin tucked  in. Keep your neck drawn back, and keep your elbows bent at a right angle. Your arms should look like the letter "L." ? Sit high and close to the steering wheel when you drive. Add lower back (lumbar) support to your car seat, if needed.  Take short walks on even surfaces as soon as you are able. Try to increase the length of time you walk each day.  Do not sit, drive, or stand in one place for more than 30 minutes at a time. Sitting or standing for long periods of time can put stress on your back.  Do not drive or use heavy machinery while taking prescription pain medicine.  Use proper lifting techniques. When you bend and lift, use positions that put less stress on your back: ? Bend your knees. ? Keep the load close to your body. ? Avoid twisting.  Exercise regularly as told by your health care provider. Exercising helps your back heal faster and helps prevent back injuries by keeping muscles strong and flexible.  Work with a physical therapist to make a safe exercise program, as recommended by your health care provider. Do any exercises as told by your physical therapist. Lifestyle  Maintain a healthy weight. Extra weight puts stress on your back and makes it difficult to have good posture.  Avoid activities or situations that make you feel anxious or stressed. Stress and anxiety increase muscle   tension and can make back pain worse. Learn ways to manage anxiety and stress, such as through exercise. General instructions  Sleep on a firm mattress in a comfortable position. Try lying on your side with your knees slightly bent. If you lie on your back, put a pillow under your knees.  Follow your treatment plan as told by your health care provider. This may include: ? Cognitive or behavioral therapy. ? Acupuncture or massage therapy. ? Meditation or yoga. Contact a health care provider if:  You have pain that is not relieved with rest or medicine.  You have increasing pain going down  into your legs or buttocks.  Your pain does not improve after 2 weeks.  You have pain at night.  You lose weight without trying.  You have a fever or chills. Get help right away if:  You develop new bowel or bladder control problems.  You have unusual weakness or numbness in your arms or legs.  You develop nausea or vomiting.  You develop abdominal pain.  You feel faint. Summary  Acute back pain is sudden and usually short-lived.  Use proper lifting techniques. When you bend and lift, use positions that put less stress on your back.  Take over-the-counter and prescription medicines and apply heat or ice as directed by your health care provider. This information is not intended to replace advice given to you by your health care provider. Make sure you discuss any questions you have with your health care provider. Document Released: 12/27/2004 Document Revised: 04/17/2018 Document Reviewed: 08/10/2016 Elsevier Patient Education  2020 [employer]  

## 2018-11-22 NOTE — Progress Notes (Signed)
   Subjective:    Patient ID: TRITAN RIEGSECKER, male    DOB: 19-Oct-1950, 68 y.o.   MRN: [id number]   Chief Complaint: Back Pain (fall)   HPI patient comes in today c/o back pain. He said he fell off his steps and landed on his back. Happened Saturday. Pain has gradually gotten worse. He rates his back pain 7-8/10. Pain is worse with rising rom sitting to standing or twisting. If he sneezes he gets a sharp pain in back. Pain is in left lower back. Does not radiate.    Review of Systems  Constitutional: Negative for activity change and appetite change.  HENT: Negative.   Eyes: Negative for pain.  Respiratory: Negative for shortness of breath.   Cardiovascular: Negative for chest pain, palpitations and leg swelling.  Gastrointestinal: Negative for abdominal pain.  Endocrine: Negative for polydipsia.  Genitourinary: Negative.   Musculoskeletal: Positive for back pain.  Skin: Negative for rash.  Neurological: Negative for dizziness, weakness and headaches.  Hematological: Does not bruise/bleed easily.  Psychiatric/Behavioral: Negative.   All other systems reviewed and are negative.      Objective:   Physical Exam Vitals signs and nursing note reviewed.  Constitutional:      Appearance: He is obese.  Cardiovascular:     Rate and Rhythm: Normal rate and regular rhythm.     Heart sounds: Normal heart sounds.  Pulmonary:     Breath sounds: Normal breath sounds.  Musculoskeletal:     Comments: Decrease ROM of lumar and thoracic spine wth pain on rotation and flexion (-) SLR bil Motor strength and sensation distally in tact.  Skin:    General: Skin is warm.  Neurological:     General: No focal deficit present.     Mental Status: He is alert and oriented to person, place, and time.     Sensory: No sensory deficit.  Psychiatric:        Mood and Affect: Mood normal.        Behavior: Behavior normal.    BP 129/83   Pulse (!) 115   Temp 97.6 F (36.4 C) (Temporal)   Ht 5\' 8"   (1.727 m)   Wt 208 lb 6.4 oz (94.5 kg)   SpO2 97%   BMI 31.69 kg/m         Assessment & Plan:  DAMONTEZ BAYUK in today with chief complaint of Back Pain (fall)   1. Fall, initial encounter - DG Thoracic Spine 2 View; Future  2. Acute left-sided thoracic back pain Moist heat Rest RTO prn - naproxen (NAPROSYN) 500 MG tablet; Take 1 tablet (500 mg total) by mouth 2 (two) times daily with a meal.  Dispense: 60 tablet; Refill: 1 - cyclobenzaprine (FLEXERIL) 10 MG tablet; Take 1 tablet (10 mg total) by mouth 3 (three) times daily as needed for muscle spasms.  Dispense: 30 tablet; Refill: Louise, FNP

## 2018-12-10 DIAGNOSIS — Z20828 Contact with and (suspected) exposure to other viral communicable diseases: Secondary | ICD-10-CM | POA: Diagnosis not present

## 2018-12-10 DIAGNOSIS — Z9181 History of falling: Secondary | ICD-10-CM | POA: Diagnosis not present

## 2018-12-10 DIAGNOSIS — K219 Gastro-esophageal reflux disease without esophagitis: Secondary | ICD-10-CM | POA: Diagnosis not present

## 2018-12-10 DIAGNOSIS — G8929 Other chronic pain: Secondary | ICD-10-CM | POA: Diagnosis not present

## 2018-12-10 DIAGNOSIS — E785 Hyperlipidemia, unspecified: Secondary | ICD-10-CM | POA: Diagnosis not present

## 2018-12-10 DIAGNOSIS — I10 Essential (primary) hypertension: Secondary | ICD-10-CM | POA: Diagnosis not present

## 2018-12-10 DIAGNOSIS — E669 Obesity, unspecified: Secondary | ICD-10-CM | POA: Diagnosis not present

## 2018-12-10 DIAGNOSIS — Z791 Long term (current) use of non-steroidal anti-inflammatories (NSAID): Secondary | ICD-10-CM | POA: Diagnosis not present

## 2018-12-20 ENCOUNTER — Emergency Department (HOSPITAL_BASED_OUTPATIENT_CLINIC_OR_DEPARTMENT_OTHER)
Admission: EM | Admit: 2018-12-20 | Discharge: 2018-12-20 | Disposition: A | Discharge status: Home / Self Care | Payer: Medicare HMO | Attending: Emergency Medicine | Admitting: Emergency Medicine

## 2018-12-20 ENCOUNTER — Emergency Department (HOSPITAL_BASED_OUTPATIENT_CLINIC_OR_DEPARTMENT_OTHER): Payer: Medicare HMO

## 2018-12-20 ENCOUNTER — Other Ambulatory Visit: Payer: Self-pay

## 2018-12-20 ENCOUNTER — Encounter (HOSPITAL_BASED_OUTPATIENT_CLINIC_OR_DEPARTMENT_OTHER): Payer: Self-pay | Admitting: *Deleted

## 2018-12-20 DIAGNOSIS — S41012A Laceration without foreign body of left shoulder, initial encounter: Secondary | ICD-10-CM | POA: Insufficient documentation

## 2018-12-20 DIAGNOSIS — Y939 Activity, unspecified: Secondary | ICD-10-CM | POA: Diagnosis not present

## 2018-12-20 DIAGNOSIS — S0990XA Unspecified injury of head, initial encounter: Secondary | ICD-10-CM | POA: Insufficient documentation

## 2018-12-20 DIAGNOSIS — W19XXXA Unspecified fall, initial encounter: Secondary | ICD-10-CM

## 2018-12-20 DIAGNOSIS — E785 Hyperlipidemia, unspecified: Secondary | ICD-10-CM | POA: Insufficient documentation

## 2018-12-20 DIAGNOSIS — Z23 Encounter for immunization: Secondary | ICD-10-CM | POA: Diagnosis not present

## 2018-12-20 DIAGNOSIS — Z79899 Other long term (current) drug therapy: Secondary | ICD-10-CM | POA: Insufficient documentation

## 2018-12-20 DIAGNOSIS — S199XXA Unspecified injury of neck, initial encounter: Secondary | ICD-10-CM | POA: Diagnosis not present

## 2018-12-20 DIAGNOSIS — I10 Essential (primary) hypertension: Secondary | ICD-10-CM | POA: Insufficient documentation

## 2018-12-20 DIAGNOSIS — Y929 Unspecified place or not applicable: Secondary | ICD-10-CM | POA: Insufficient documentation

## 2018-12-20 DIAGNOSIS — W11XXXA Fall on and from ladder, initial encounter: Secondary | ICD-10-CM | POA: Insufficient documentation

## 2018-12-20 DIAGNOSIS — Y999 Unspecified external cause status: Secondary | ICD-10-CM | POA: Diagnosis not present

## 2018-12-20 DIAGNOSIS — R7303 Prediabetes: Secondary | ICD-10-CM | POA: Insufficient documentation

## 2018-12-20 MED ORDER — TETANUS-DIPHTH-ACELL PERTUSSIS 5-2.5-18.5 LF-MCG/0.5 IM SUSP
0.5000 mL | Freq: Once | INTRAMUSCULAR | Status: AC
  Administered 2018-12-20: 0.5 mL via INTRAMUSCULAR
  Filled 2018-12-20: qty 0.5

## 2018-12-20 MED ORDER — METHOCARBAMOL 500 MG PO TABS: 500.0000 mg | ORAL_TABLET | Freq: Two times a day (BID) | ORAL | 0 refills | Status: DC

## 2018-12-20 MED ORDER — MELOXICAM 7.5 MG PO TABS: 7.5000 mg | ORAL_TABLET | Freq: Every day | ORAL | 0 refills | Status: DC

## 2018-12-20 NOTE — ED Notes (Signed)
Offered Pt. Urinal and he refused after asking for one.  Pt. Then asked what his results of CT scan was .Marland Kitchen RN reports to Pt. And Wife that as soon as Dr. Marguerite Olea result he will be in with them.

## 2018-12-20 NOTE — ED Triage Notes (Signed)
He fell off a ladder landing on plywood. His feet were about 3 feet up the ladder. Abrasion to his left upper arm. Pain in his shoulder. He hit the left side of his head. No LOC. No pain in his head. He does not take blood thinners.

## 2018-12-20 NOTE — ED Provider Notes (Signed)
Johnathan [name]   CSN: [id number] Arrival date & time: 12/20/18  1435     History Chief Complaint  Patient presents with  . Fall    Johnathan [name] is a 68 y.o. male with a past medical history significant for GERD, hyperlipidemia, hypertension, and prediabetes who presents to the ED after falling off a ladder around 1PM today.  Patient notes he lost his balance and fell roughly 3 to 4 feet off a ladder landing on the left side of his head and left shoulder.  Patient denies loss of consciousness.  Patient is not currently on any blood thinners.  Patient was able to ambulate at the scene after the accident without difficulty. Patient has an abrasion to left shoulder and admits to left shoulder pain worse with movement. Patient is unsure when his last tetanus shot was. Patient notes directly after his fall he had bilateral blurry vision, but has since resolved. Patient has not tried anything for pain prior to arrival. Patient denies chest pain, shortness of breath, abdominal pain, nausea, and vomiting.   Past Medical History:  Diagnosis Date  . GERD (gastroesophageal reflux disease)   . Hyperlipidemia   . Hypertension     Patient Active Problem List   Diagnosis Date Noted  . Prediabetes 12/28/2016  . Obesity 06/27/2016  . Hyperlipidemia LDL goal <130 06/27/2016  . Hypertension 06/27/2016  . GERD (gastroesophageal reflux disease) 06/27/2016  . Internal hemorrhoids 02/29/2012  . External hemorrhoids 02/29/2012  . Anal skin tag 02/29/2012    Past Surgical History:  Procedure Laterality Date  . CATARACT EXTRACTION    . EXCISION OF SKIN TAG N/A 12/28/2015   Procedure: EXCISION OF ANAL  SKIN TAGS AND 3 SKIN TAGS LEFT THIGH;  Surgeon: Georganna Skeans, MD;  Location: [place];  Service: General;  Laterality: N/A;  . HEMORRHOID SURGERY  12/28/2015   Procedure: HEMORRHOIDECTOMY x2;  Surgeon: Georganna Skeans, MD;  Location: [facility];  Service: General;;  . POLYPECTOMY     from vocal cords       Family History  Problem Relation Age of Onset  . Cancer Mother        breast  . Hepatitis C Brother     Social History   Tobacco Use  . Smoking status: Never Smoker  . Smokeless tobacco: Never Used  Substance Use Topics  . Alcohol use: No  . Drug use: No    Home Medications Prior to Admission medications   Medication Sig Start Date End Date Taking? Authorizing Provider  cyclobenzaprine (FLEXERIL) 10 MG tablet Take 1 tablet (10 mg total) by mouth 3 (three) times daily as needed for muscle spasms. 11/22/18   Hassell Done Mary-Margaret, FNP  Glucosamine-Chondroit-Vit C-Mn (GLUCOSAMINE 1500 COMPLEX PO) Take by mouth.    [provider]  hydrochlorothiazide (HYDRODIURIL) 25 MG tablet Take 1 tablet (25 mg total) by mouth daily. 07/02/18   Dettinger, Fransisca Kaufmann, MD  HYDROcodone-homatropine [facility]) 5-1.5 MG/5ML syrup Take 5 mLs by mouth every 8 (eight) hours as needed for cough. Please cancel previous Rx for Robitussin [facility] 10/02/18   Ronnie Doss M, DO  lisinopril (ZESTRIL) 40 MG tablet Take 1 tablet (40 mg total) by mouth daily. 07/02/18   Dettinger, Fransisca Kaufmann, MD  meloxicam (MOBIC) 7.5 MG tablet Take 1 tablet (7.5 mg total) by mouth daily. 12/20/18   Cheek, Comer Locket, PA-C  methocarbamol (ROBAXIN) 500 MG tablet Take 1 tablet (500 mg total) by  mouth 2 (two) times daily. 12/20/18   Cheek, Comer Locket, PA-C  naproxen (NAPROSYN) 500 MG tablet Take 1 tablet (500 mg total) by mouth 2 (two) times daily with a meal. 11/22/18   Hassell Done, Mary-Margaret, FNP  omeprazole (PRILOSEC) 40 MG capsule Take 1 capsule (40 mg total) by mouth daily. 07/02/18   Dettinger, Fransisca Kaufmann, MD  pravastatin (PRAVACHOL) 40 MG tablet Take 1 tablet (40 mg total) by mouth daily. 07/02/18   Dettinger, Fransisca Kaufmann, MD  sildenafil (REVATIO) 20 MG tablet TAKE 1-3 TABLETS (20-60 MG TOTAL) BY MOUTH AS NEEDED. 01/22/18   Dettinger, Fransisca Kaufmann, MD     Allergies    Patient has no known allergies.  Review of Systems   Review of Systems  Constitutional: Negative for chills and fever.  Eyes: Positive for visual disturbance (resolved).  Respiratory: Negative for shortness of breath.   Cardiovascular: Negative for chest pain and leg swelling.  Gastrointestinal: Negative for abdominal pain, diarrhea, nausea and vomiting.  Musculoskeletal: Positive for arthralgias. Negative for back pain, gait problem, joint swelling, neck pain and neck stiffness.  Skin: Positive for color change and wound.  Neurological: Positive for headaches. Negative for dizziness, syncope and weakness.    Physical Exam Updated Vital Signs BP (!) 156/94   Pulse 81   Temp 98 F (36.7 C) (Oral)   Resp 18   Ht 5\' 8"  (1.727 m)   Wt 90.7 kg   SpO2 100%   BMI 30.41 kg/m   Physical Exam Vitals and nursing [name] reviewed.  Constitutional:      General: He is not in acute distress.    Appearance: He is not ill-appearing.  HENT:     Head: Normocephalic.  Eyes:     Pupils: Pupils are equal, round, and reactive to light.  Neck:     Comments: No cervical midline tenderness Cardiovascular:     Rate and Rhythm: Normal rate and regular rhythm.     Pulses: Normal pulses.     Heart sounds: Normal heart sounds. No murmur. No friction rub. No gallop.   Pulmonary:     Effort: Pulmonary effort is normal.     Breath sounds: Normal breath sounds.  Abdominal:     General: Abdomen is flat. Bowel sounds are normal. There is no distension.     Palpations: Abdomen is soft.     Tenderness: There is no abdominal tenderness. There is no guarding or rebound.  Musculoskeletal:     Cervical back: Neck supple.     Comments: No T-spine and L-spine midline tenderness, no stepoff or deformity No leg edema bilaterally Patient moves all extremities without difficulty. DP/PT pulses 2+ and equal bilaterally Sensation grossly intact bilaterally Strength of knee flexion and extension  is 5/5 Plantar and dorsiflexion of ankle 5/5 Achilles and patellar reflexes present and equal Able to ambulate without difficulty  Left shoulder: tenderness to palpation directly below left scapula. Full ROM of left shoulder. Neurovascularly intact.  Skin:    Comments: 5cm shallow laceration on right shoulder. Mild bleeding.   Neurological:     General: No focal deficit present.     Mental Status: He is alert.     Comments: Speech is clear, able to follow commands CN III-XII intact Normal strength in upper and lower extremities bilaterally including dorsiflexion and plantar flexion, strong and equal grip strength Sensation grossly intact throughout Moves extremities without ataxia, coordination intact No pronator drift Ambulates without difficulty      ED Results / Procedures /  Treatments   Labs (all labs ordered are listed, but only abnormal results are displayed) Labs Reviewed - No data to display  EKG None  Radiology CT Head Wo Contrast  Result Date: 12/20/2018 CLINICAL DATA:  Head trauma, minor. Additional history provided: Patient fell off ladder approximately 3 feet landing on plywood. EXAM: CT HEAD WITHOUT CONTRAST CT CERVICAL SPINE WITHOUT CONTRAST TECHNIQUE: Multidetector CT imaging of the head and cervical spine was performed following the standard protocol without intravenous contrast. Multiplanar CT image reconstructions of the cervical spine were also generated. COMPARISON:  CT head and cervical spine 12/18/2011 FINDINGS: CT HEAD FINDINGS Brain: No evidence of acute intracranial hemorrhage. No demarcated cortical infarction. No evidence of intracranial mass. No midline shift or extra-axial fluid collection. Vascular: No hyperdense vessel. Atherosclerotic calcifications. Skull: Normal. Negative for fracture or focal lesion. Sinuses/Orbits: Visualized orbits demonstrate no acute abnormality. Other: No significant paranasal sinus disease or mastoid effusion at the imaged  levels. Remote fractures of the bilateral nasal bones. CT CERVICAL SPINE FINDINGS Mildly motion degraded examination. Alignment: No significant spondylolisthesis. Nonspecific reversal of the expected cervical lordosis. Skull base and vertebrae: The basion-dental and atlanto-dental intervals are maintained.No evidence of acute fracture to the cervical spine. Soft tissues and spinal canal: No prevertebral fluid or swelling. No visible canal hematoma. Disc levels: Cervical spondylosis with multilevel posterior disc osteophytes, uncovertebral and facet hypertrophy. No high-grade bony spinal canal stenosis. Multilevel ventral osteophytes. Upper chest: Imaged lung apices clear IMPRESSION: CT head: No evidence of acute intracranial abnormality. CT cervical spine: 1. Mildly motion degraded examination. 2. No evidence of acute fracture to the cervical spine. 3. Nonspecific reversal of the expected cervical lordosis. 4. Cervical spondylosis. Electronically Signed   By: Kellie Simmering DO   On: 12/20/2018 16:53   CT Cervical Spine Wo Contrast  Result Date: 12/20/2018 CLINICAL DATA:  Head trauma, minor. Additional history provided: Patient fell off ladder approximately 3 feet landing on plywood. EXAM: CT HEAD WITHOUT CONTRAST CT CERVICAL SPINE WITHOUT CONTRAST TECHNIQUE: Multidetector CT imaging of the head and cervical spine was performed following the standard protocol without intravenous contrast. Multiplanar CT image reconstructions of the cervical spine were also generated. COMPARISON:  CT head and cervical spine 12/18/2011 FINDINGS: CT HEAD FINDINGS Brain: No evidence of acute intracranial hemorrhage. No demarcated cortical infarction. No evidence of intracranial mass. No midline shift or extra-axial fluid collection. Vascular: No hyperdense vessel. Atherosclerotic calcifications. Skull: Normal. Negative for fracture or focal lesion. Sinuses/Orbits: Visualized orbits demonstrate no acute abnormality. Other: No  significant paranasal sinus disease or mastoid effusion at the imaged levels. Remote fractures of the bilateral nasal bones. CT CERVICAL SPINE FINDINGS Mildly motion degraded examination. Alignment: No significant spondylolisthesis. Nonspecific reversal of the expected cervical lordosis. Skull base and vertebrae: The basion-dental and atlanto-dental intervals are maintained.No evidence of acute fracture to the cervical spine. Soft tissues and spinal canal: No prevertebral fluid or swelling. No visible canal hematoma. Disc levels: Cervical spondylosis with multilevel posterior disc osteophytes, uncovertebral and facet hypertrophy. No high-grade bony spinal canal stenosis. Multilevel ventral osteophytes. Upper chest: Imaged lung apices clear IMPRESSION: CT head: No evidence of acute intracranial abnormality. CT cervical spine: 1. Mildly motion degraded examination. 2. No evidence of acute fracture to the cervical spine. 3. Nonspecific reversal of the expected cervical lordosis. 4. Cervical spondylosis. Electronically Signed   By: Kellie Simmering DO   On: 12/20/2018 16:53    Procedures Procedures (including critical care time)  Medications Ordered in ED Medications  Tdap (Pillager)  injection 0.5 mL (0.5 mLs Intramuscular Given 12/20/18 1642)    ED Course  I have reviewed the triage vital signs and the nursing notes.  Pertinent labs & imaging results that were available during my care of the patient were reviewed by me and considered in my medical decision making (see chart for details).    MDM Rules/Calculators/A&P     CHA2DS2/VAS Stroke Risk Points      N/A >= 2 Points: High Risk  1 - 1.99 Points: Medium Risk  0 Points: Low Risk    A final score could not be computed because of missing components.: Last  Change: N/A     This score determines the patient's risk of having a stroke if the  patient has atrial fibrillation.      This score is not applicable to this patient. Components are not   calculated.                   68 year old male presents to the ED for evaluation after following off a ladder roughly 3 feet high.  Vitals all within normal limit except mildly elevated BP at 142/95 likely due to pain and diagnosed hypertension. Will continue to monitor.  5 cm shallow laceration on left shoulder with mild bleeding. Too shallow to warrant sutures.  Wound thoroughly cleaned and dressed here in the ED. Tetanus booster given. Neurological exam normal.  No midline cervical, thoracic, or lumbar tenderness. Tenderness to palpation below left scapula with full ROM of left shoulder. Shared decision making and patient does not want left shoulder x-ray at this time. Patient able to ambulate without difficulty in the ED.  Abdomen soft, nontender, nondistended.  Doubt intra-abdominal injuries at this time.  Mild left tenderness on the scalp with no crepitus or deformity noted. Will obtain CT head and cervical spine to rule out intracranial abnormalities and cervical bony fractures.  CT images personally reviewed which is negative for acute abnormalities. Discussed concussion precautions with patient. Will send patient home with meloxicam and Robaxin. Advised patient that robaxin can cause drowsiness and to not drive or operate machinery while on the medication. Patient has been advised to follow-up with PCP within the next week to recheck symptoms. Strict ED precautions discussed with patient. Patient states understanding and agrees to plan. Patient discharged home in no acute distress and stable vitals  Final Clinical Impression(s) / ED Diagnoses Final diagnoses:  Injury of head, initial encounter  Fall, initial encounter    Rx / DC Orders ED Discharge Orders         Ordered    methocarbamol (ROBAXIN) 500 MG tablet  2 times daily     12/20/18 1756    meloxicam (MOBIC) 7.5 MG tablet  Daily     12/20/18 1756           Romie Levee 12/21/18 0058    Margette Fast,  MD 12/21/18 1310

## 2018-12-20 NOTE — Discharge Instructions (Signed)
As discussed, your CT images were negative for acute abnormalities. I am sending you home with a pain medication called Meloxicam. You may take 1 tablet daily as needed for pain. Do not mix with other over the counter pain medications. I am also sending you home with Robaxin which is a muscle relaxer. It can cause drowsiness, so do not drive or operate machinery while on the medication. I recommend following up with your PCP within the next week to recheck your symptoms. Return to the ER for new or worsening symptoms.

## 2018-12-21 ENCOUNTER — Telehealth: Payer: Self-pay | Admitting: Family Medicine

## 2018-12-21 NOTE — Telephone Encounter (Signed)
Appointment scheduled on 12/31/2018 with Dr. Warrick Parisian for a tele-visit.

## 2018-12-31 ENCOUNTER — Ambulatory Visit (INDEPENDENT_AMBULATORY_CARE_PROVIDER_SITE_OTHER): Payer: Medicare HMO | Admitting: Family Medicine

## 2018-12-31 ENCOUNTER — Encounter: Payer: Self-pay | Admitting: Family Medicine

## 2018-12-31 DIAGNOSIS — S46912D Strain of unspecified muscle, fascia and tendon at shoulder and upper arm level, left arm, subsequent encounter: Secondary | ICD-10-CM

## 2018-12-31 DIAGNOSIS — W19XXXD Unspecified fall, subsequent encounter: Secondary | ICD-10-CM | POA: Diagnosis not present

## 2018-12-31 NOTE — Progress Notes (Signed)
Virtual Visit via telephone Note  I connected with Johnathan [name] on 12/31/18 at 1417 by telephone and verified that I am speaking with the correct person using two identifiers. Johnathan [name] is currently located at home and no other people are currently with her during visit. The provider, Fransisca Kaufmann Janesha Brissette, MD is located in their office at time of visit.  Call ended at 1425  I discussed the limitations, risks, security and privacy concerns of performing an evaluation and management service by telephone and the availability of in person appointments. I also discussed with the patient that there may be a patient responsible charge related to this service. The patient expressed understanding and agreed to proceed.   History and Present Illness: Patient had a fall from a ladder on 12/20/2018.  He got a small cut on his left shoulder and a bruise and his shoulder blade is not as bad as it was.  It is improving every day.  He was given meloxicam and methacarbamol.   No diagnosis found.  Outpatient Encounter Medications as of 12/31/2018  Medication Sig  . cyclobenzaprine (FLEXERIL) 10 MG tablet Take 1 tablet (10 mg total) by mouth 3 (three) times daily as needed for muscle spasms.  . Glucosamine-Chondroit-Vit C-Mn (GLUCOSAMINE 1500 COMPLEX PO) Take by mouth.  . hydrochlorothiazide (HYDRODIURIL) 25 MG tablet Take 1 tablet (25 mg total) by mouth daily.  Marland Kitchen HYDROcodone-homatropine (HYCODAN) 5-1.5 MG/5ML syrup Take 5 mLs by mouth every 8 (eight) hours as needed for cough. Please cancel previous Rx for Robitussin AC  . lisinopril (ZESTRIL) 40 MG tablet Take 1 tablet (40 mg total) by mouth daily.  . meloxicam (MOBIC) 7.5 MG tablet Take 1 tablet (7.5 mg total) by mouth daily.  . methocarbamol (ROBAXIN) 500 MG tablet Take 1 tablet (500 mg total) by mouth 2 (two) times daily.  . naproxen (NAPROSYN) 500 MG tablet Take 1 tablet (500 mg total) by mouth 2 (two) times daily with a meal.  . omeprazole  (PRILOSEC) 40 MG capsule Take 1 capsule (40 mg total) by mouth daily.  . pravastatin (PRAVACHOL) 40 MG tablet Take 1 tablet (40 mg total) by mouth daily.  . sildenafil (REVATIO) 20 MG tablet TAKE 1-3 TABLETS (20-60 MG TOTAL) BY MOUTH AS NEEDED.   No facility-administered encounter medications on file as of 12/31/2018.    Review of Systems  Constitutional: Negative for chills and fever.  Eyes: Negative for visual disturbance.  Respiratory: Negative for shortness of breath and wheezing.   Cardiovascular: Negative for chest pain and leg swelling.  Musculoskeletal: Negative for back pain and gait problem.  Skin: Negative for rash.  Neurological: Negative for dizziness, weakness and numbness.  All other systems reviewed and are negative.   Observations/Objective: Patient sounds comfortable and in no acute distress  Assessment and Plan: Problem List Items Addressed This Visit    None    Visit Diagnoses    Fall, subsequent encounter    -  Primary   Shoulder strain, left, subsequent encounter          Patient feels like he is doing a lot better and wants to go ahead and stop the meloxicam and the methocarbamol and were agreeable towards that and he will just use it only if he needs it in the future. Follow up plan: Return if symptoms worsen or fail to improve.     I discussed the assessment and treatment plan with the patient. The patient was provided an opportunity to ask  questions and all were answered. The patient agreed with the plan and demonstrated an understanding of the instructions.   The patient was advised to call back or seek an in-person evaluation if the symptoms worsen or if the condition fails to improve as anticipated.  The above assessment and management plan was discussed with the patient. The patient verbalized understanding of and has agreed to the management plan. Patient is aware to call the clinic if symptoms persist or worsen. Patient is aware when to return  to the clinic for a follow-up visit. Patient educated on when it is appropriate to go to the emergency department.    I provided 8 minutes of non-face-to-face time during this encounter.    Worthy Rancher, MD

## 2019-03-27 DIAGNOSIS — R69 Illness, unspecified: Secondary | ICD-10-CM | POA: Diagnosis not present

## 2019-05-02 ENCOUNTER — Other Ambulatory Visit: Payer: Self-pay | Admitting: Dermatology

## 2019-05-16 DIAGNOSIS — K219 Gastro-esophageal reflux disease without esophagitis: Secondary | ICD-10-CM | POA: Diagnosis not present

## 2019-05-16 DIAGNOSIS — E785 Hyperlipidemia, unspecified: Secondary | ICD-10-CM | POA: Diagnosis not present

## 2019-05-16 DIAGNOSIS — Z803 Family history of malignant neoplasm of breast: Secondary | ICD-10-CM | POA: Diagnosis not present

## 2019-05-16 DIAGNOSIS — N529 Male erectile dysfunction, unspecified: Secondary | ICD-10-CM | POA: Diagnosis not present

## 2019-05-16 DIAGNOSIS — I10 Essential (primary) hypertension: Secondary | ICD-10-CM | POA: Diagnosis not present

## 2019-05-16 DIAGNOSIS — Z008 Encounter for other general examination: Secondary | ICD-10-CM | POA: Diagnosis not present

## 2019-05-16 DIAGNOSIS — G8929 Other chronic pain: Secondary | ICD-10-CM | POA: Diagnosis not present

## 2019-05-16 DIAGNOSIS — Z8249 Family history of ischemic heart disease and other diseases of the circulatory system: Secondary | ICD-10-CM | POA: Diagnosis not present

## 2019-05-16 DIAGNOSIS — Z6831 Body mass index (BMI) 31.0-31.9, adult: Secondary | ICD-10-CM | POA: Diagnosis not present

## 2019-05-16 DIAGNOSIS — E1165 Type 2 diabetes mellitus with hyperglycemia: Secondary | ICD-10-CM | POA: Diagnosis not present

## 2019-05-16 DIAGNOSIS — E669 Obesity, unspecified: Secondary | ICD-10-CM | POA: Diagnosis not present

## 2019-05-23 DIAGNOSIS — R69 Illness, unspecified: Secondary | ICD-10-CM | POA: Diagnosis not present

## 2019-05-27 ENCOUNTER — Ambulatory Visit (INDEPENDENT_AMBULATORY_CARE_PROVIDER_SITE_OTHER): Payer: Medicare HMO | Admitting: Family Medicine

## 2019-05-27 ENCOUNTER — Encounter: Payer: Self-pay | Admitting: Family Medicine

## 2019-05-27 DIAGNOSIS — R3912 Poor urinary stream: Secondary | ICD-10-CM

## 2019-05-27 DIAGNOSIS — N529 Male erectile dysfunction, unspecified: Secondary | ICD-10-CM | POA: Diagnosis not present

## 2019-05-27 DIAGNOSIS — N401 Enlarged prostate with lower urinary tract symptoms: Secondary | ICD-10-CM | POA: Diagnosis not present

## 2019-05-27 MED ORDER — TAMSULOSIN HCL 0.4 MG PO CAPS: 0.4000 mg | ORAL_CAPSULE | Freq: Every day | ORAL | 3 refills | Status: DC

## 2019-05-27 MED ORDER — SILDENAFIL CITRATE 20 MG PO TABS: 20.0000 mg | ORAL_TABLET | ORAL | 1 refills | Status: AC | PRN

## 2019-05-27 NOTE — Progress Notes (Signed)
Virtual Visit via telephone Note  I connected with Johnathan [name] on 05/27/19 at 1342 by telephone and verified that I am speaking with the correct person using two identifiers. Johnathan [name] is currently located at home and no other people are currently with her during visit. The provider, Fransisca Kaufmann Garland Smouse, MD is located in their office at time of visit.  Call ended at 1354  I discussed the limitations, risks, security and privacy concerns of performing an evaluation and management service by telephone and the availability of in person appointments. I also discussed with the patient that there may be a patient responsible charge related to this service. The patient expressed understanding and agreed to proceed.   History and Present Illness: Patient is feeling difficulty with erections and has been going on for the better part of the year. Patient denies fatigue or energy issues.  He does have a slow stream and incomplete emptying.  He wakes up 1-2 times per night sometimes. He denies pain with erections or intercourse.   No diagnosis found.  Outpatient Encounter Medications as of 05/27/2019  Medication Sig  . cyclobenzaprine (FLEXERIL) 10 MG tablet Take 1 tablet (10 mg total) by mouth 3 (three) times daily as needed for muscle spasms.  . Glucosamine-Chondroit-Vit C-Mn (GLUCOSAMINE 1500 COMPLEX PO) Take by mouth.  . hydrochlorothiazide (HYDRODIURIL) 25 MG tablet Take 1 tablet (25 mg total) by mouth daily.  Marland Kitchen lisinopril (ZESTRIL) 40 MG tablet Take 1 tablet (40 mg total) by mouth daily.  . meloxicam (MOBIC) 7.5 MG tablet Take 1 tablet (7.5 mg total) by mouth daily.  . methocarbamol (ROBAXIN) 500 MG tablet Take 1 tablet (500 mg total) by mouth 2 (two) times daily.  . naproxen (NAPROSYN) 500 MG tablet Take 1 tablet (500 mg total) by mouth 2 (two) times daily with a meal.  . omeprazole (PRILOSEC) 40 MG capsule Take 1 capsule (40 mg total) by mouth daily.  . pravastatin (PRAVACHOL) 40 MG  tablet Take 1 tablet (40 mg total) by mouth daily.  . sildenafil (REVATIO) 20 MG tablet TAKE 1-3 TABLETS (20-60 MG TOTAL) BY MOUTH AS NEEDED.   No facility-administered encounter medications on file as of 05/27/2019.    Review of Systems  Constitutional: Negative for chills and fever.  Respiratory: Negative for shortness of breath and wheezing.   Cardiovascular: Negative for chest pain and leg swelling.  Genitourinary: Positive for decreased urine volume, difficulty urinating and frequency.  Musculoskeletal: Negative for back pain and gait problem.  Skin: Negative for rash.  Neurological: Negative for dizziness, weakness and light-headedness.  All other systems reviewed and are negative.   Observations/Objective: Patient sounds comfortable and in no acute distress  Assessment and Plan: Problem List Items Addressed This Visit    None    Visit Diagnoses    Erectile dysfunction, unspecified erectile dysfunction type    -  Primary   Relevant Medications   sildenafil (REVATIO) 20 MG tablet   Other Relevant Orders   Testosterone,Free and Total   PSA, total and free   Benign prostatic hyperplasia with weak urinary stream       Relevant Medications   tamsulosin (FLOMAX) 0.4 MG CAPS capsule   Other Relevant Orders   Testosterone,Free and Total   PSA, total and free      Increased dose of sildenafil back to where he can take up to 5 and sent a new prescription, will also try Flomax because it sounds like he is having some prostate  issues which may be affecting his erectile dysfunction. Follow up plan: Return if symptoms worsen or fail to improve.     I discussed the assessment and treatment plan with the patient. The patient was provided an opportunity to ask questions and all were answered. The patient agreed with the plan and demonstrated an understanding of the instructions.   The patient was advised to call back or seek an in-person evaluation if the symptoms worsen or if the  condition fails to improve as anticipated.  The above assessment and management plan was discussed with the patient. The patient verbalized understanding of and has agreed to the management plan. Patient is aware to call the clinic if symptoms persist or worsen. Patient is aware when to return to the clinic for a follow-up visit. Patient educated on when it is appropriate to go to the emergency department.    I provided 12 minutes of non-face-to-face time during this encounter.    Worthy Rancher, MD

## 2019-05-28 ENCOUNTER — Other Ambulatory Visit: Payer: Medicare HMO

## 2019-05-28 ENCOUNTER — Other Ambulatory Visit: Payer: Self-pay

## 2019-05-28 DIAGNOSIS — N401 Enlarged prostate with lower urinary tract symptoms: Secondary | ICD-10-CM

## 2019-05-28 DIAGNOSIS — R3912 Poor urinary stream: Secondary | ICD-10-CM | POA: Diagnosis not present

## 2019-05-28 DIAGNOSIS — N529 Male erectile dysfunction, unspecified: Secondary | ICD-10-CM

## 2019-06-01 LAB — TESTOSTERONE,FREE AND TOTAL
Testosterone, Free: 5.7 pg/mL — ABNORMAL LOW (ref 6.6–18.1)
Testosterone: 333 ng/dL (ref 264–916)

## 2019-06-01 LAB — PSA, TOTAL AND FREE
PSA, Free Pct: 26.7 %
PSA, Free: 0.72 ng/mL
Prostate Specific Ag, Serum: 2.7 ng/mL (ref 0.0–4.0)

## 2019-06-06 ENCOUNTER — Telehealth: Payer: Self-pay | Admitting: Family Medicine

## 2019-06-06 NOTE — Telephone Encounter (Signed)
Reviewed labs with patient. °

## 2019-06-19 ENCOUNTER — Other Ambulatory Visit: Payer: Self-pay

## 2019-06-19 ENCOUNTER — Ambulatory Visit (INDEPENDENT_AMBULATORY_CARE_PROVIDER_SITE_OTHER): Payer: Medicare HMO | Admitting: Family Medicine

## 2019-06-19 ENCOUNTER — Encounter: Payer: Self-pay | Admitting: Family Medicine

## 2019-06-19 VITALS — BP 117/75 | HR 78 | Temp 97.8°F | Resp 20 | Ht 68.0 in | Wt 203.5 lb

## 2019-06-19 DIAGNOSIS — L03114 Cellulitis of left upper limb: Secondary | ICD-10-CM | POA: Diagnosis not present

## 2019-06-19 DIAGNOSIS — W57XXXA Bitten or stung by nonvenomous insect and other nonvenomous arthropods, initial encounter: Secondary | ICD-10-CM

## 2019-06-19 MED ORDER — DOXYCYCLINE HYCLATE 100 MG PO CAPS: 100.0000 mg | ORAL_CAPSULE | Freq: Two times a day (BID) | ORAL | 0 refills | Status: DC

## 2019-06-19 NOTE — Progress Notes (Signed)
No chief complaint on file.   HPI  Patient presents today for bit by a tick 2 to 3 days ago.  Wife removed for him.  It was lodged in the ventral aspect of the left upper arm about 3 cm below the axilla.  It was itching but not hurting.  It had not started to enlarge.  The patient does not recall when he last noted the area without a tick on it.  It probably was there a day or 2 only.  He had been working out in the yard.  The place is now red and swollen.  They are concerned about infections including Lyme's.  Its described as having been about the size of a pencil eraser.  No white dot.  PMH: Smoking status noted ROS: Per HPI  Objective: BP 117/75   Pulse 78   Temp 97.8 F (36.6 C) (Temporal)   Resp 20   Ht 5\' 8"  (1.727 m)   Wt 203 lb 8 oz (92.3 kg)   SpO2 97%   BMI 30.94 kg/m  Gen: NAD, alert, cooperative with exam HEENT: NCAT, EOMI, PERRL CV: RRR, good S1/S2, no murmur Resp: CTABL, no wheezes, non-labored Skin: There is a central raised area with erythema and edema approximately 1.5 cm at the underarm area on the left upper arm.  There is a slightly less erythematous area of 8 cm surrounding this. Ext: No edema, warm Neuro: Alert and oriented, No gross deficits  Assessment and plan:  1. Tick bite, initial encounter   2. Cellulitis of left upper extremity     Meds ordered this encounter  Medications  . doxycycline (VIBRAMYCIN) 100 MG capsule    Sig: Take 1 capsule (100 mg total) by mouth 2 (two) times daily.    Dispense:  20 capsule    Refill:  0    No orders of the defined types were placed in this encounter.   Follow up as needed.  Claretta Fraise, MD

## 2019-06-20 ENCOUNTER — Encounter: Payer: Self-pay | Admitting: Family Medicine

## 2019-07-14 ENCOUNTER — Other Ambulatory Visit: Payer: Self-pay | Admitting: Family Medicine

## 2019-07-16 NOTE — Telephone Encounter (Signed)
Patient had a tele visit 05/27/19- please advise

## 2019-07-16 NOTE — Telephone Encounter (Signed)
Dettinger. NTBS LOV 07/02/18

## 2019-07-28 ENCOUNTER — Other Ambulatory Visit: Payer: Self-pay | Admitting: Family Medicine

## 2019-07-28 DIAGNOSIS — K219 Gastro-esophageal reflux disease without esophagitis: Secondary | ICD-10-CM

## 2019-08-16 ENCOUNTER — Other Ambulatory Visit: Payer: Self-pay | Admitting: Family Medicine

## 2019-08-16 DIAGNOSIS — N401 Enlarged prostate with lower urinary tract symptoms: Secondary | ICD-10-CM

## 2019-08-16 DIAGNOSIS — R3912 Poor urinary stream: Secondary | ICD-10-CM

## 2019-08-19 ENCOUNTER — Other Ambulatory Visit: Payer: Self-pay

## 2019-08-19 DIAGNOSIS — E785 Hyperlipidemia, unspecified: Secondary | ICD-10-CM

## 2019-08-19 DIAGNOSIS — R7303 Prediabetes: Secondary | ICD-10-CM

## 2019-08-19 DIAGNOSIS — I10 Essential (primary) hypertension: Secondary | ICD-10-CM

## 2019-08-21 ENCOUNTER — Other Ambulatory Visit: Payer: Medicare HMO

## 2019-08-21 ENCOUNTER — Other Ambulatory Visit: Payer: Self-pay

## 2019-08-21 DIAGNOSIS — E785 Hyperlipidemia, unspecified: Secondary | ICD-10-CM

## 2019-08-21 DIAGNOSIS — I10 Essential (primary) hypertension: Secondary | ICD-10-CM

## 2019-08-21 DIAGNOSIS — R7303 Prediabetes: Secondary | ICD-10-CM

## 2019-08-22 ENCOUNTER — Other Ambulatory Visit: Payer: Medicare HMO

## 2019-08-22 DIAGNOSIS — R7303 Prediabetes: Secondary | ICD-10-CM | POA: Diagnosis not present

## 2019-08-22 DIAGNOSIS — I10 Essential (primary) hypertension: Secondary | ICD-10-CM | POA: Diagnosis not present

## 2019-08-22 DIAGNOSIS — E785 Hyperlipidemia, unspecified: Secondary | ICD-10-CM | POA: Diagnosis not present

## 2019-08-22 LAB — LIPID PANEL

## 2019-08-22 LAB — BAYER DCA HB A1C WAIVED: HB A1C (BAYER DCA - WAIVED): 6.5 % (ref <7.0)

## 2019-08-23 ENCOUNTER — Encounter: Payer: Self-pay | Admitting: Family Medicine

## 2019-08-23 ENCOUNTER — Telehealth (INDEPENDENT_AMBULATORY_CARE_PROVIDER_SITE_OTHER): Payer: Medicare HMO | Admitting: Family Medicine

## 2019-08-23 DIAGNOSIS — I10 Essential (primary) hypertension: Secondary | ICD-10-CM

## 2019-08-23 DIAGNOSIS — R7303 Prediabetes: Secondary | ICD-10-CM | POA: Diagnosis not present

## 2019-08-23 DIAGNOSIS — R3912 Poor urinary stream: Secondary | ICD-10-CM

## 2019-08-23 DIAGNOSIS — K219 Gastro-esophageal reflux disease without esophagitis: Secondary | ICD-10-CM

## 2019-08-23 DIAGNOSIS — E785 Hyperlipidemia, unspecified: Secondary | ICD-10-CM

## 2019-08-23 DIAGNOSIS — N401 Enlarged prostate with lower urinary tract symptoms: Secondary | ICD-10-CM

## 2019-08-23 MED ORDER — PRAVASTATIN SODIUM 40 MG PO TABS: 40.0000 mg | ORAL_TABLET | Freq: Every day | ORAL | 3 refills | Status: DC

## 2019-08-23 MED ORDER — HYDROCHLOROTHIAZIDE 25 MG PO TABS: 25.0000 mg | ORAL_TABLET | Freq: Every day | ORAL | 3 refills | Status: DC

## 2019-08-23 MED ORDER — TAMSULOSIN HCL 0.4 MG PO CAPS: 0.4000 mg | ORAL_CAPSULE | Freq: Every day | ORAL | 3 refills | Status: DC

## 2019-08-23 MED ORDER — LISINOPRIL 40 MG PO TABS: 40.0000 mg | ORAL_TABLET | Freq: Every day | ORAL | 3 refills | Status: DC

## 2019-08-23 MED ORDER — OMEPRAZOLE 40 MG PO CPDR: 40.0000 mg | DELAYED_RELEASE_CAPSULE | Freq: Every day | ORAL | 3 refills | Status: DC

## 2019-08-23 NOTE — Progress Notes (Signed)
Virtual Visit via telephone Note  I connected with Johnathan [name] on 08/23/19 at 1258 by telephone and verified that I am speaking with the correct person using two identifiers. Johnathan [name] is currently located at home and wife are currently with her during visit. The provider, Fransisca Kaufmann Shalon Councilman, MD is located in their office at time of visit.  Call ended at 1315  I discussed the limitations, risks, security and privacy concerns of performing an evaluation and management service by telephone and the availability of in person appointments. I also discussed with the patient that there may be a patient responsible charge related to this service. The patient expressed understanding and agreed to proceed.   History and Present Illness: Prediabetes Patient comes in today for recheck of his diabetes. Patient has been currently taking no medication currently and his A1c was 6.5, he has been diet controlled but has crept up just slightly from 6.4.. Patient is currently on an ACE inhibitor/ARB. Patient has seen an ophthalmologist this year. Patient denies any issues with their feet. The symptom started onset as an adult hypertension and hyperlipidemia ARE RELATED TO DM   Hypertension Patient is currently on lisinopril and hydrochlorothiazide, and their blood pressure today is unknown but he says it has been running good when he is gone to the [place]. Patient denies any lightheadedness or dizziness. Patient denies headaches, blurred vision, chest pains, shortness of breath, or weakness. Denies any side effects from medication and is content with current medication.   Hyperlipidemia Patient is coming in for recheck of his hyperlipidemia. The patient is currently taking pravastatin. They deny any issues with myalgias or history of liver damage from it. They deny any focal numbness or weakness or chest pain.   BPH Patient is coming in for recheck on BPH Symptoms: Decreased flow but is not awakening at  night and is not retaining for him Medication: Tamsulosin Last PSA: 2.7 on May 2021  No diagnosis found.  Outpatient Encounter Medications as of 08/23/2019  Medication Sig  . cyclobenzaprine (FLEXERIL) 10 MG tablet Take 1 tablet (10 mg total) by mouth 3 (three) times daily as needed for muscle spasms. (Patient not taking: Reported on 06/19/2019)  . doxycycline (VIBRAMYCIN) 100 MG capsule Take 1 capsule (100 mg total) by mouth 2 (two) times daily.  . Glucosamine-Chondroit-Vit C-Mn (GLUCOSAMINE 1500 COMPLEX PO) Take by mouth.  . hydrochlorothiazide (HYDRODIURIL) 25 MG tablet Take 1 tablet (25 mg total) by mouth daily.  Marland Kitchen lisinopril (ZESTRIL) 40 MG tablet Take 1 tablet (40 mg total) by mouth daily.  . meloxicam (MOBIC) 7.5 MG tablet Take 1 tablet (7.5 mg total) by mouth daily. (Patient not taking: Reported on 06/19/2019)  . methocarbamol (ROBAXIN) 500 MG tablet Take 1 tablet (500 mg total) by mouth 2 (two) times daily. (Patient not taking: Reported on 06/19/2019)  . naproxen (NAPROSYN) 500 MG tablet Take 1 tablet (500 mg total) by mouth 2 (two) times daily with a meal. (Patient not taking: Reported on 06/19/2019)  . omeprazole (PRILOSEC) 40 MG capsule Take 1 capsule (40 mg total) by mouth daily. (Needs to be seen before next refill)  . pravastatin (PRAVACHOL) 40 MG tablet Take 1 tablet (40 mg total) by mouth daily.  . sildenafil (REVATIO) 20 MG tablet Take 1-5 tablets (20-100 mg total) by mouth as needed.  . tamsulosin (FLOMAX) 0.4 MG CAPS capsule TAKE 1 CAPSULE BY MOUTH EVERY DAY   No facility-administered encounter medications on file as of 08/23/2019.  Review of Systems  Constitutional: Negative for chills and fever.  Respiratory: Negative for shortness of breath and wheezing.   Cardiovascular: Negative for chest pain and leg swelling.  Musculoskeletal: Negative for back pain and gait problem.  Skin: Negative for rash.  Neurological: Negative for dizziness, weakness and light-headedness.  All  other systems reviewed and are negative.   Observations/Objective: Patient sounds comfortable and in no acute distress  Assessment and Plan: Problem List Items Addressed This Visit      Cardiovascular and Mediastinum   Hypertension   Relevant Medications   lisinopril (ZESTRIL) 40 MG tablet   hydrochlorothiazide (HYDRODIURIL) 25 MG tablet   pravastatin (PRAVACHOL) 40 MG tablet   Other Relevant Orders   CMP14+EGFR     Digestive   GERD (gastroesophageal reflux disease)   Relevant Medications   omeprazole (PRILOSEC) 40 MG capsule   Other Relevant Orders   CBC with Differential/Platelet     Other   Hyperlipidemia LDL goal <130   Relevant Medications   lisinopril (ZESTRIL) 40 MG tablet   hydrochlorothiazide (HYDRODIURIL) 25 MG tablet   pravastatin (PRAVACHOL) 40 MG tablet   Other Relevant Orders   Lipid panel   Prediabetes - Primary   Relevant Orders   Bayer DCA Hb A1c Waived    Other Visit Diagnoses    Benign prostatic hyperplasia with weak urinary stream       Relevant Medications   tamsulosin (FLOMAX) 0.4 MG CAPS capsule   Other Relevant Orders   PSA, total and free      Continue current medication, seems to be doing well, no changes, he also sees the [place] as well. Follow up plan: Return in about 6 months (around 02/23/2020), or if symptoms worsen or fail to improve, for prediabetes, htn hld.     I discussed the assessment and treatment plan with the patient. The patient was provided an opportunity to ask questions and all were answered. The patient agreed with the plan and demonstrated an understanding of the instructions.   The patient was advised to call back or seek an in-person evaluation if the symptoms worsen or if the condition fails to improve as anticipated.  The above assessment and management plan was discussed with the patient. The patient verbalized understanding of and has agreed to the management plan. Patient is aware to call the clinic if symptoms  persist or worsen. Patient is aware when to return to the clinic for a follow-up visit. Patient educated on when it is appropriate to go to the emergency department.    I provided 17 minutes of non-face-to-face time during this encounter.    Worthy Rancher, MD

## 2019-08-26 LAB — CBC WITH DIFFERENTIAL/PLATELET
Basophils Absolute: 0 10*3/uL (ref 0.0–0.2)
Basos: 1 %
EOS (ABSOLUTE): 0.2 10*3/uL (ref 0.0–0.4)
Eos: 3 %
Hematocrit: 45.1 % (ref 37.5–51.0)
Hemoglobin: 15.3 g/dL (ref 13.0–17.7)
Immature Grans (Abs): 0 10*3/uL (ref 0.0–0.1)
Immature Granulocytes: 0 %
Lymphocytes Absolute: 1.9 10*3/uL (ref 0.7–3.1)
Lymphs: 33 %
MCH: 32.1 pg (ref 26.6–33.0)
MCHC: 33.9 g/dL (ref 31.5–35.7)
MCV: 95 fL (ref 79–97)
Monocytes Absolute: 0.6 10*3/uL (ref 0.1–0.9)
Monocytes: 10 %
Neutrophils Absolute: 3.2 10*3/uL (ref 1.4–7.0)
Neutrophils: 53 %
Platelets: 153 10*3/uL (ref 150–450)
RBC: 4.77 x10E6/uL (ref 4.14–5.80)
RDW: 12.8 % (ref 11.6–15.4)
WBC: 5.9 10*3/uL (ref 3.4–10.8)

## 2019-08-26 LAB — LIPID PANEL
Chol/HDL Ratio: 3.9 ratio (ref 0.0–5.0)
Cholesterol, Total: 140 mg/dL (ref 100–199)
HDL: 36 mg/dL — ABNORMAL LOW (ref >39)
LDL Chol Calc (NIH): 83 mg/dL (ref 0–99)
Triglycerides: 118 mg/dL (ref 0–149)
VLDL Cholesterol Cal: 21 mg/dL (ref 5–40)

## 2019-08-26 LAB — CMP14+EGFR
ALT: 17 IU/L (ref 0–44)
AST: 29 IU/L (ref 0–40)
Albumin/Globulin Ratio: 1.6 (ref 1.2–2.2)
Albumin: 4.4 g/dL (ref 3.8–4.8)
Alkaline Phosphatase: 78 IU/L (ref 48–121)
BUN/Creatinine Ratio: 21 (ref 10–24)
BUN: 21 mg/dL (ref 8–27)
Bilirubin Total: 0.7 mg/dL (ref 0.0–1.2)
CO2: 20 mmol/L (ref 20–29)
Calcium: 9 mg/dL (ref 8.6–10.2)
Chloride: 102 mmol/L (ref 96–106)
Creatinine, Ser: 0.99 mg/dL (ref 0.76–1.27)
GFR calc Af Amer: 89 mL/min/{1.73_m2} (ref >59)
GFR calc non Af Amer: 77 mL/min/{1.73_m2} (ref >59)
Globulin, Total: 2.8 g/dL (ref 1.5–4.5)
Glucose: 133 mg/dL — ABNORMAL HIGH (ref 65–99)
Potassium: 3.5 mmol/L (ref 3.5–5.2)
Sodium: 138 mmol/L (ref 134–144)
Total Protein: 7.2 g/dL (ref 6.0–8.5)

## 2019-08-26 LAB — TESTOSTERONE,FREE AND TOTAL
Testosterone, Free: 6.4 pg/mL — ABNORMAL LOW (ref 6.6–18.1)
Testosterone: 375 ng/dL (ref 264–916)

## 2019-10-01 DIAGNOSIS — R69 Illness, unspecified: Secondary | ICD-10-CM | POA: Diagnosis not present

## 2019-10-21 ENCOUNTER — Telehealth: Payer: Self-pay

## 2019-10-21 NOTE — Telephone Encounter (Signed)
pt wants to know if he is up to date on pneumonia shot. Please call back

## 2019-10-21 NOTE — Telephone Encounter (Signed)
Aware not up to date.

## 2019-12-02 IMAGING — DX DG THORACIC SPINE 2V
3 series · 3 of 3 positions shown · non-contrast
Comparison: None.

CLINICAL DATA: Pain status post fall

EXAM:
THORACIC SPINE 2 VIEWS

[t-spine ap]
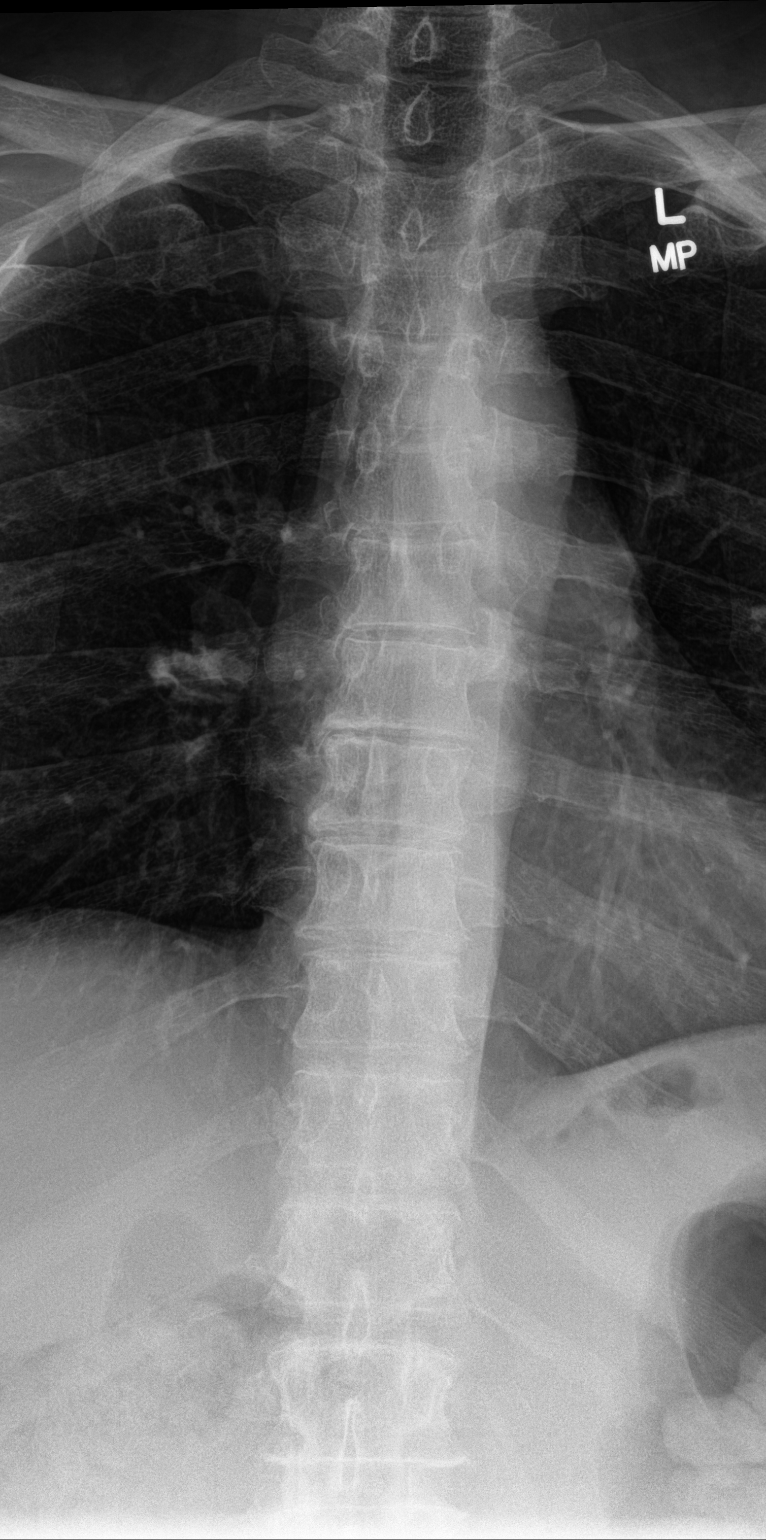

[t-spine lat]
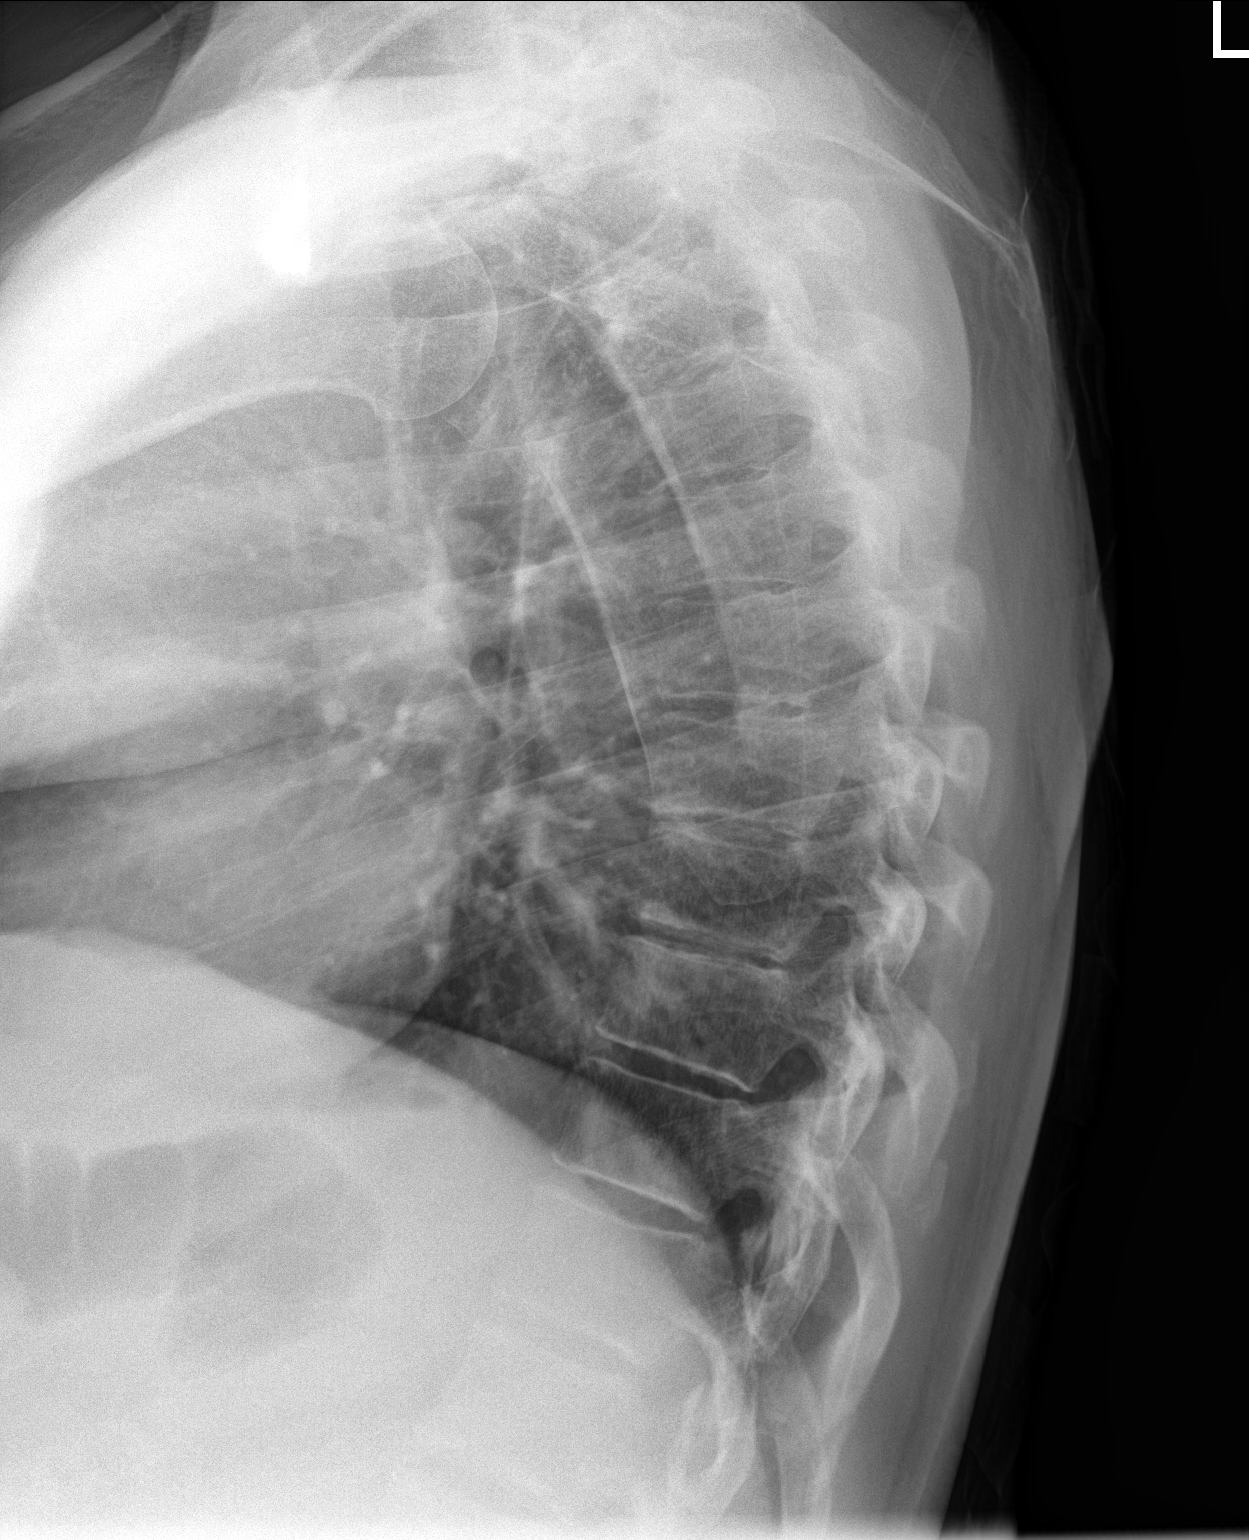

[t-spine lat swimmers]
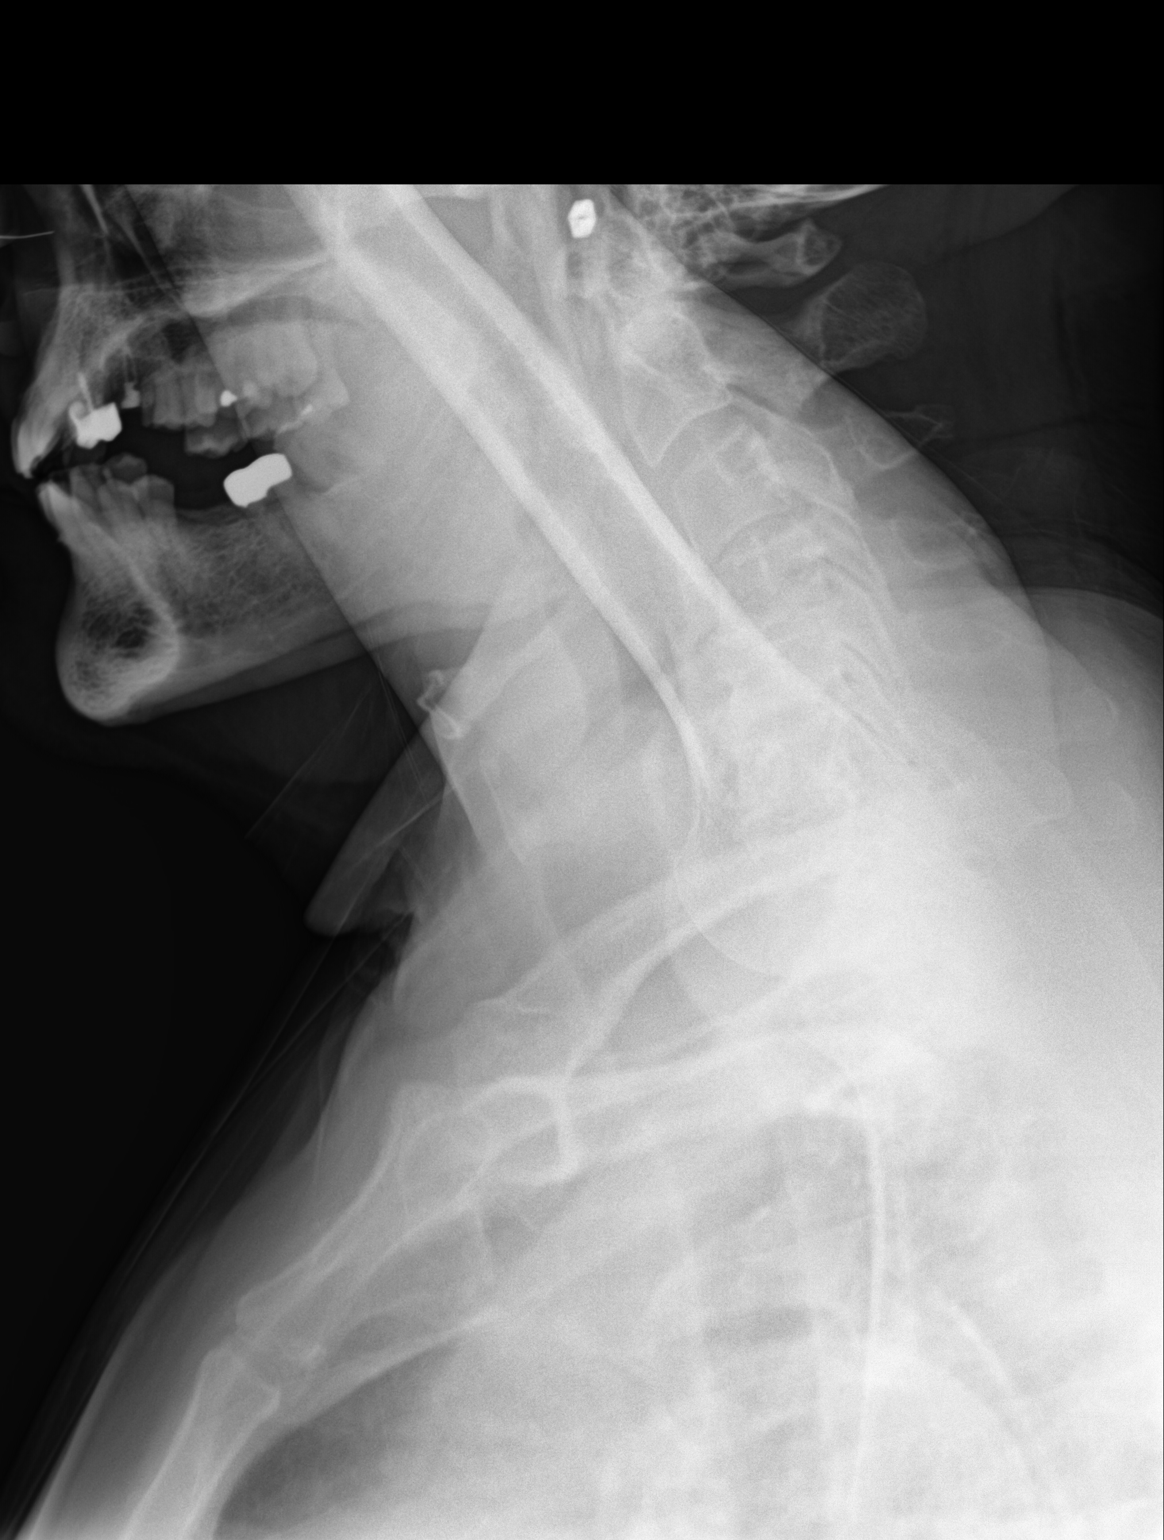

[3 of 3 positions shown; findings below may reference images not displayed]

FINDINGS: There is no evidence of thoracic spine fracture. Alignment is
normal. No other significant bone abnormalities are identified.
Evaluation of the upper thoracic spine was limited by overlapping
osseous structures.
IMPRESSION: Negative.

## 2019-12-03 ENCOUNTER — Telehealth: Payer: Self-pay

## 2019-12-03 NOTE — Telephone Encounter (Signed)
Returned patient's phone call.  Patient states that he ate a chuckwagon sandwich around 4pm from a [employer] and has had nausea, vomiting, diarrhea and leg cramps since then.  Patient states that he is drinking water but able to tolerate food at this time.  Patient is not taking OTC medication.  Advised patient he may try pepto bismol

## 2019-12-03 NOTE — Telephone Encounter (Signed)
Patient aware and verbalized understanding. °

## 2019-12-03 NOTE — Telephone Encounter (Signed)
Recommend that he start drinking Gatorade if he is having cramping in the legs this may be a result of electrolyte deficiency in the setting of diarrhea and vomiting.  If symptoms worsen for any reason he needs to seek care immediately at the urgent care.  Otherwise, food poisoning typically resolves within a couple of days of onset.  Antibiotics are not usually indicated.  I think Pepto-Bismol may be helpful.  Would try and avoid use of something like Imodium because this may prolong his foodborne illness.

## 2019-12-30 IMAGING — CT CT CERVICAL SPINE W/O CM
3 of 4 series · 11 of 33 positions shown, 13 images · non-contrast
Comparison: CT head and cervical spine 12/18/2011

CLINICAL DATA: Head trauma, minor. Additional history provided:

EXAM:
CT HEAD WITHOUT CONTRAST
CT CERVICAL SPINE WITHOUT CONTRAST
TECHNIQUE: Multidetector CT imaging of the head and cervical spine was
performed following the standard protocol without intravenous
contrast. Multiplanar CT image reconstructions of the cervical spine
were also generated.

[Series 5: coronals · coronal · 0.32mm/px · 3 of 67 slices shown]
[im 21/67  bone]
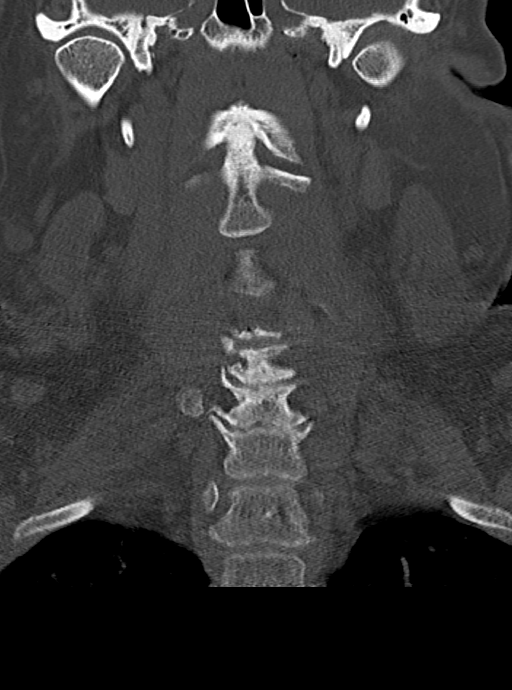
[im 29/67  bone]
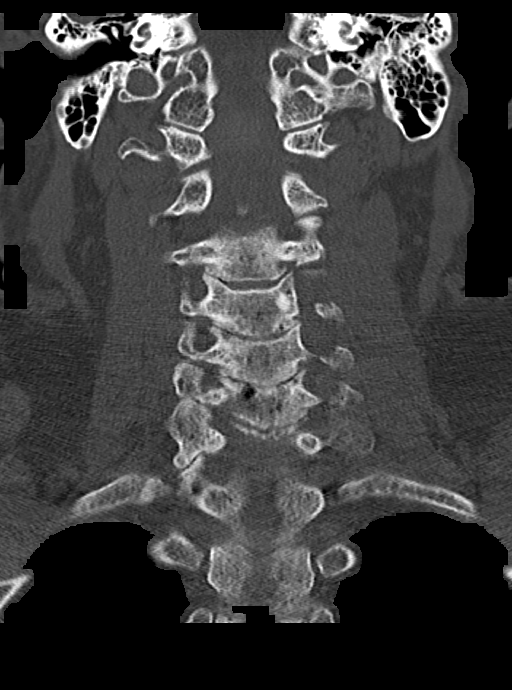
[im 38/67  bone]
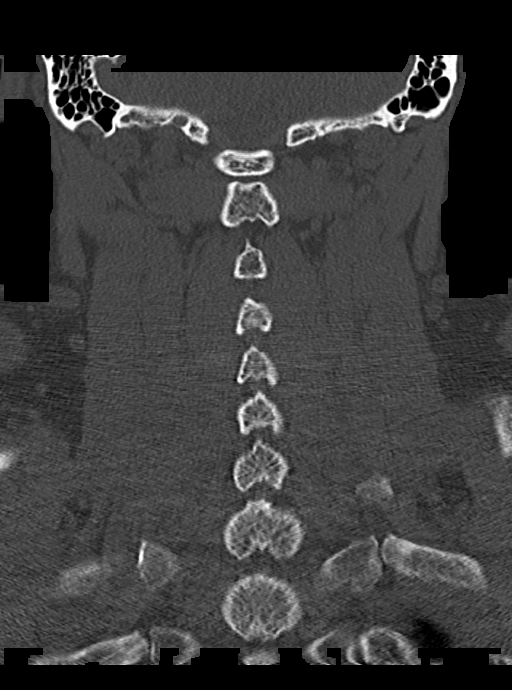

[Series 6: sagittals · sagittal · 0.26mm/px · 5 of 83 slices shown, 6 images]
[im 28/83  bone]
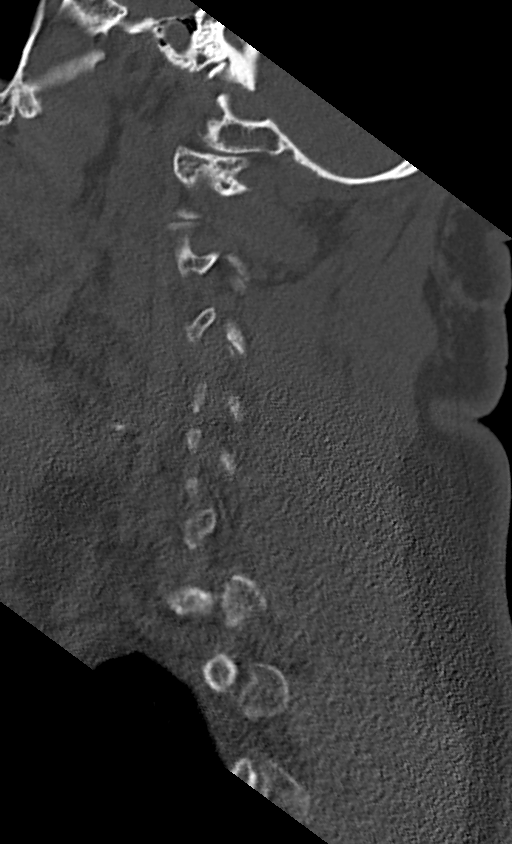
[im 35/83  bone]
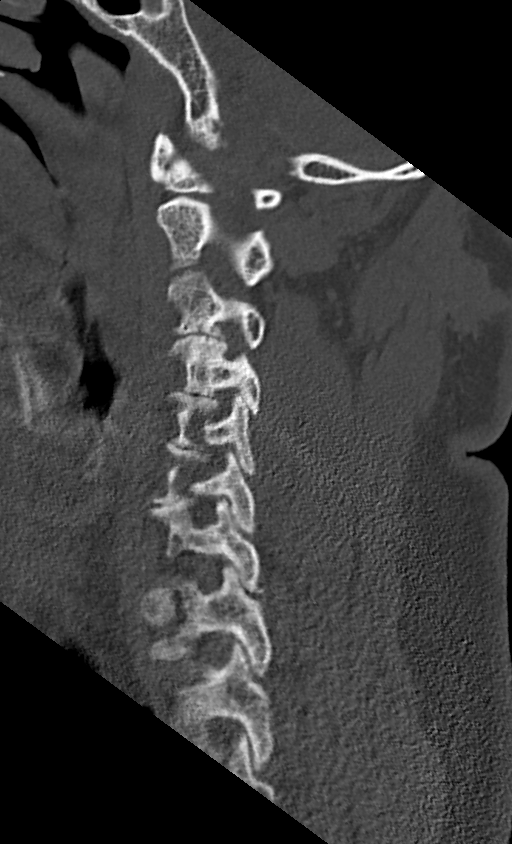
[im 42/83  soft-tissue]
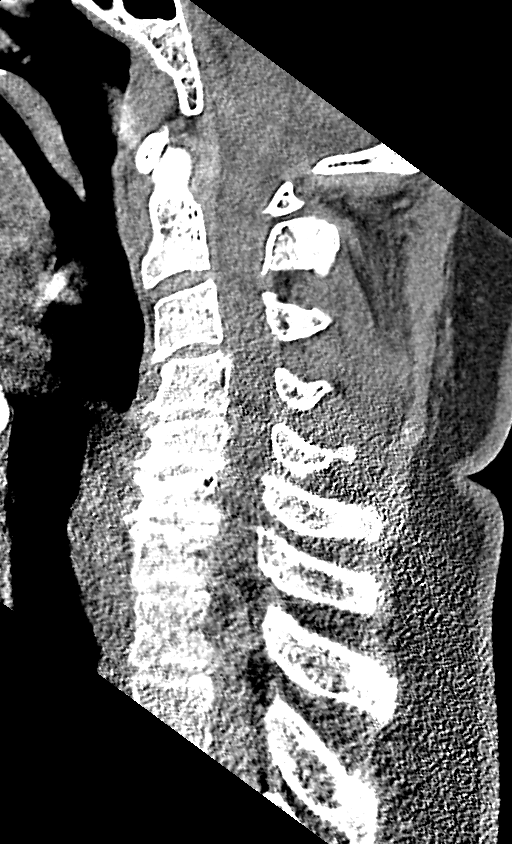
[im 42/83  bone]
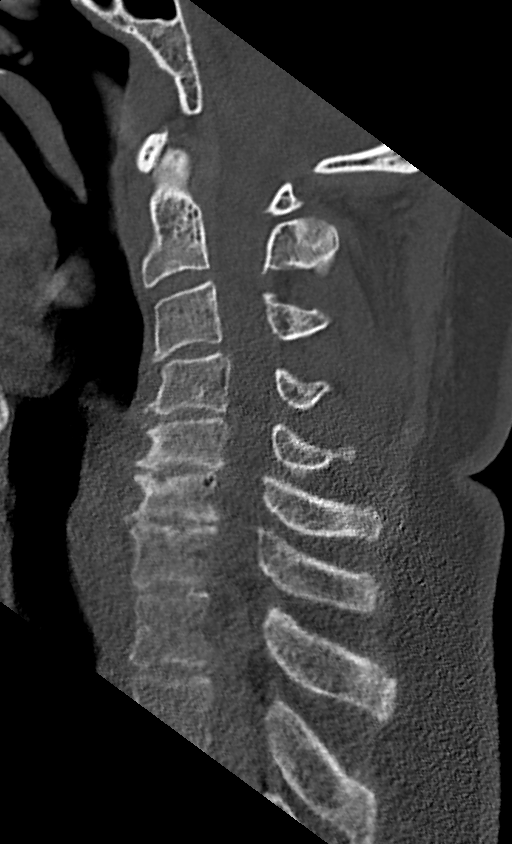
[im 48/83  bone]
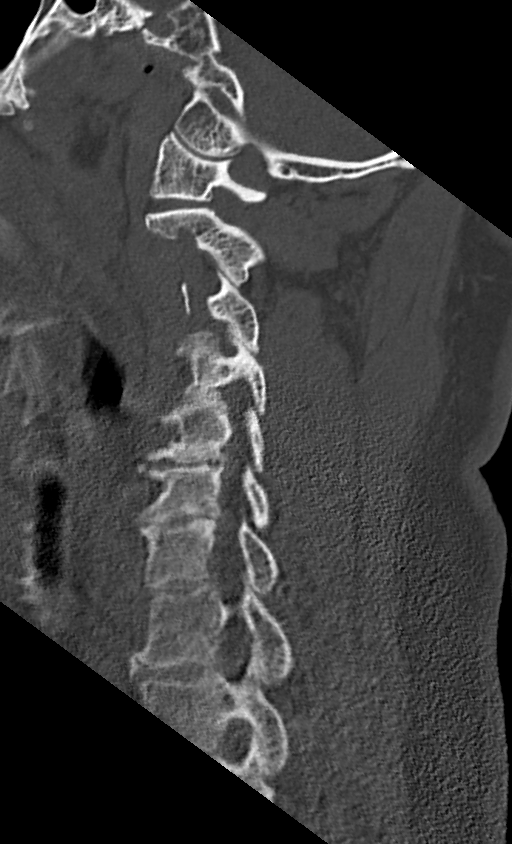
[im 55/83  bone]
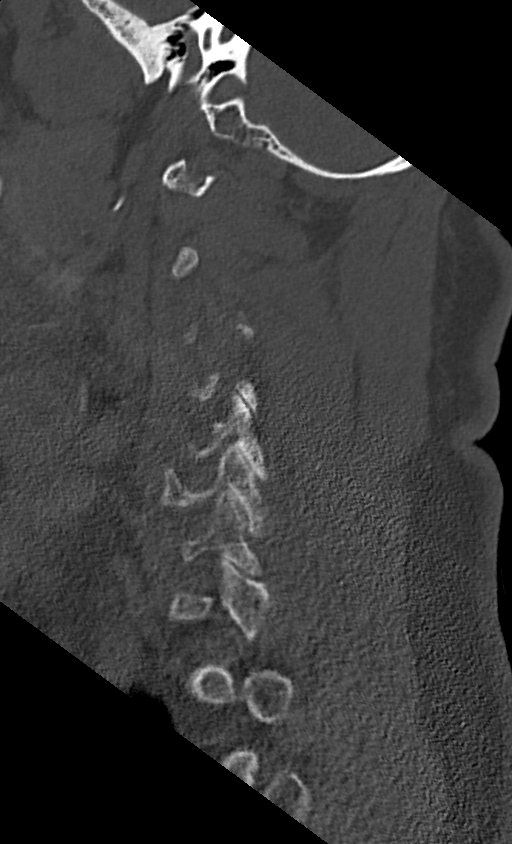

[Series 7: orthogonals · axial · 0.26mm/px · z∈[-113,-11]mm · 3 of 111 slices shown, 4 images]
[im 32/111  soft-tissue]
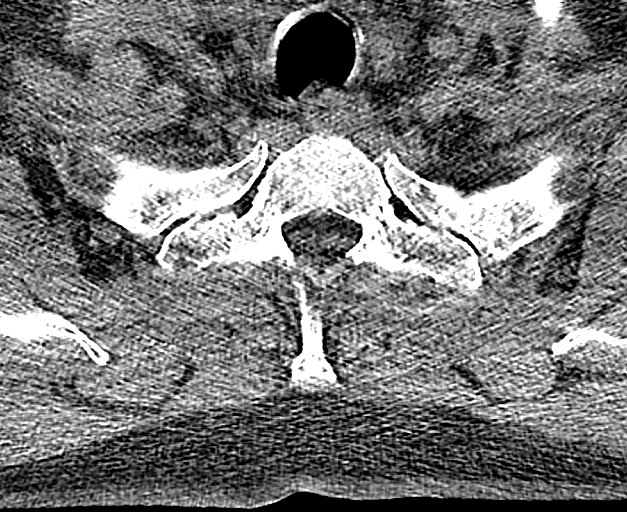
[im 32/111  bone]
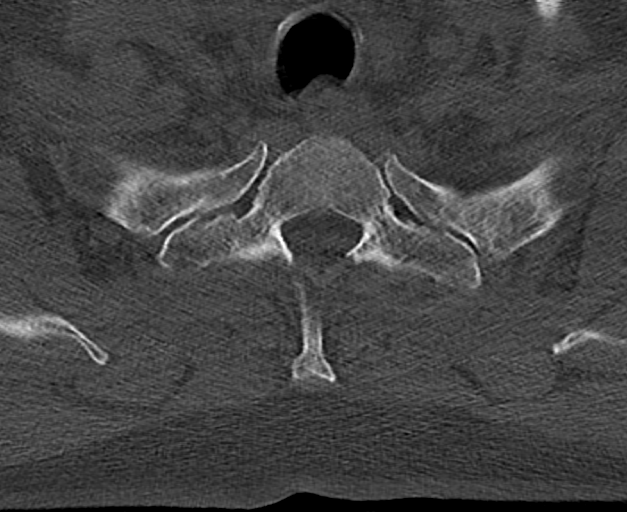
[im 63/111  bone]
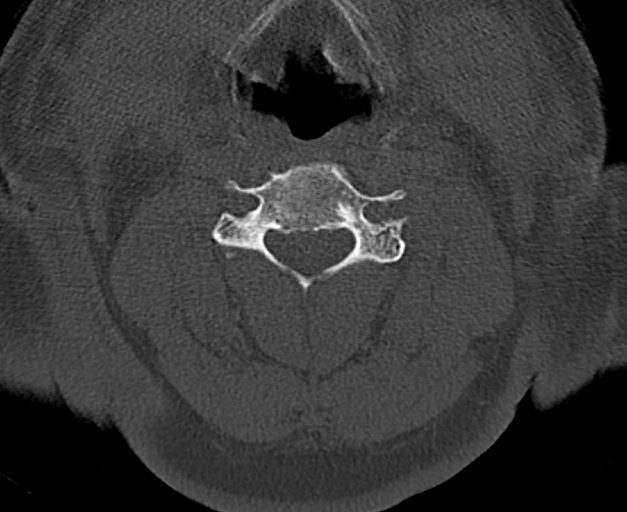
[im 95/111  bone]
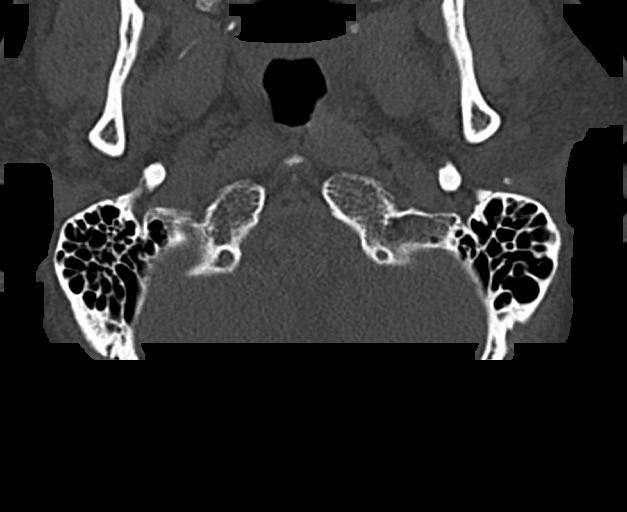

[11 of 33 positions shown; findings below may reference images not displayed]

FINDINGS: CT HEAD FINDINGS

Brain:

No evidence of acute intracranial hemorrhage.

No demarcated cortical infarction.

No evidence of intracranial mass.

No midline shift or extra-axial fluid collection.

Vascular: No hyperdense vessel. Atherosclerotic calcifications.

Skull: Normal. Negative for fracture or focal lesion.

Sinuses/Orbits: Visualized orbits demonstrate no acute abnormality.

Other: No significant paranasal sinus disease or mastoid effusion at
the imaged levels. Remote fractures of the bilateral nasal bones.

CT CERVICAL SPINE FINDINGS

Mildly motion degraded examination.

Alignment: No significant spondylolisthesis. Nonspecific reversal of
the expected cervical lordosis.

Skull base and vertebrae: The basion-dental and atlanto-dental
intervals are maintained.No evidence of acute fracture to the
cervical spine.

Soft tissues and spinal canal: No prevertebral fluid or swelling. No
visible canal hematoma.

Disc levels: Cervical spondylosis with multilevel posterior disc
osteophytes, uncovertebral and facet hypertrophy. No high-grade bony
spinal canal stenosis. Multilevel ventral osteophytes.

Upper chest: Imaged lung apices clear
IMPRESSION: CT head:

No evidence of acute intracranial abnormality.

CT cervical spine:

1. Mildly motion degraded examination.
2. No evidence of acute fracture to the cervical spine.
3. Nonspecific reversal of the expected cervical lordosis.
4. Cervical spondylosis.

## 2020-02-03 ENCOUNTER — Other Ambulatory Visit: Payer: Medicare HMO

## 2020-02-03 ENCOUNTER — Other Ambulatory Visit: Payer: Self-pay | Admitting: Internal Medicine

## 2020-02-03 DIAGNOSIS — Z20822 Contact with and (suspected) exposure to covid-19: Secondary | ICD-10-CM

## 2020-02-04 LAB — SPECIMEN STATUS REPORT

## 2020-02-04 LAB — SARS-COV-2, NAA 2 DAY TAT

## 2020-02-04 LAB — NOVEL CORONAVIRUS, NAA: SARS-CoV-2, NAA: DETECTED — AB

## 2020-03-31 ENCOUNTER — Telehealth (INDEPENDENT_AMBULATORY_CARE_PROVIDER_SITE_OTHER): Payer: Medicare HMO | Admitting: Family Medicine

## 2020-03-31 ENCOUNTER — Encounter: Payer: Self-pay | Admitting: Family Medicine

## 2020-03-31 DIAGNOSIS — R059 Cough, unspecified: Secondary | ICD-10-CM

## 2020-03-31 DIAGNOSIS — J40 Bronchitis, not specified as acute or chronic: Secondary | ICD-10-CM

## 2020-03-31 MED ORDER — METHYLPREDNISOLONE 4 MG PO TBPK: ORAL_TABLET | ORAL | 0 refills | Status: DC

## 2020-03-31 NOTE — Addendum Note (Signed)
Addended by: Liliane Bade on: 03/31/2020 03:51 PM   Modules accepted: Orders

## 2020-03-31 NOTE — Progress Notes (Signed)
Virtual Visit via Video note  I connected with Johnathan [name] on 03/31/20 at 2:44 PM by video and verified that I am speaking with the correct person using two identifiers. Johnathan [name] is currently located at home and his wife is currently with him during visit. The provider, Loman Brooklyn, FNP is located in their home at time of visit.  I discussed the limitations, risks, security and privacy concerns of performing an evaluation and management service by video and the availability of in person appointments. I also discussed with the patient that there may be a patient responsible charge related to this service. The patient expressed understanding and agreed to proceed.  Subjective: PCP: Dettinger, Fransisca Kaufmann, MD  Chief Complaint  Patient presents with  . Cough   Patient complains of cough, shortness of breath and wheezing. Onset of symptoms was 3 days ago, unchanged since that time. He is drinking moderate amounts of fluids. Evaluation to date: none. Treatment to date: Tylenol, Nyquil and Dayquil. He does not smoke. Patient has been  vaccinated against COVID-19, but has not had the booster.   ROS: Per HPI  Current Outpatient Medications:  Marland Kitchen  Glucosamine-Chondroit-Vit C-Mn (GLUCOSAMINE 1500 COMPLEX PO), Take by mouth., Disp: , Rfl:  .  hydrochlorothiazide (HYDRODIURIL) 25 MG tablet, Take 1 tablet (25 mg total) by mouth daily., Disp: 90 tablet, Rfl: 3 .  lisinopril (ZESTRIL) 40 MG tablet, Take 1 tablet (40 mg total) by mouth daily., Disp: 90 tablet, Rfl: 3 .  omeprazole (PRILOSEC) 40 MG capsule, Take 1 capsule (40 mg total) by mouth daily., Disp: 90 capsule, Rfl: 3 .  pravastatin (PRAVACHOL) 40 MG tablet, Take 1 tablet (40 mg total) by mouth daily., Disp: 90 tablet, Rfl: 3 .  sildenafil (REVATIO) 20 MG tablet, Take 1-5 tablets (20-100 mg total) by mouth as needed., Disp: 30 tablet, Rfl: 1 .  tamsulosin (FLOMAX) 0.4 MG CAPS capsule, Take 1 capsule (0.4 mg total) by mouth daily.,  Disp: 90 capsule, Rfl: 3  No Known Allergies Past Medical History:  Diagnosis Date  . GERD (gastroesophageal reflux disease)   . Hyperlipidemia   . Hypertension     Observations/Objective: Physical Exam Constitutional:      General: He is not in acute distress.    Appearance: Normal appearance. He is not ill-appearing or toxic-appearing.  Eyes:     General: No scleral icterus.       Right eye: No discharge.        Left eye: No discharge.     Conjunctiva/sclera: Conjunctivae normal.  Pulmonary:     Effort: Pulmonary effort is normal. Tachypnea present. No respiratory distress.  Neurological:     Mental Status: He is alert and oriented to person, place, and time.  Psychiatric:        Mood and Affect: Mood normal.        Behavior: Behavior normal.        Thought Content: Thought content normal.        Judgment: Judgment normal.    Assessment and Plan: 1. Bronchitis - methylPREDNISolone (MEDROL DOSEPAK) 4 MG TBPK tablet; Use as directed.  Dispense: 21 each; Refill: 0  2. Cough - Novel Coronavirus, NAA ([facility]); Future   Follow Up Instructions:   I discussed the assessment and treatment plan with the patient. The patient was provided an opportunity to ask questions and all were answered. The patient agreed with the plan and demonstrated an understanding of the instructions.   The patient  was advised to call back or seek an in-person evaluation if the symptoms worsen or if the condition fails to improve as anticipated.  The above assessment and management plan was discussed with the patient. The patient verbalized understanding of and has agreed to the management plan. Patient is aware to call the clinic if symptoms persist or worsen. Patient is aware when to return to the clinic for a follow-up visit. Patient educated on when it is appropriate to go to the emergency department.   Time call ended: 2:54 PM  I provided 10 minutes of face-to-face time during this  encounter.   Hendricks Limes, MSN, APRN, FNP-C [facility] 03/31/20

## 2020-04-01 LAB — SARS-COV-2, NAA 2 DAY TAT

## 2020-04-01 LAB — NOVEL CORONAVIRUS, NAA: SARS-CoV-2, NAA: NOT DETECTED

## 2020-04-14 DIAGNOSIS — Z803 Family history of malignant neoplasm of breast: Secondary | ICD-10-CM | POA: Diagnosis not present

## 2020-04-14 DIAGNOSIS — I10 Essential (primary) hypertension: Secondary | ICD-10-CM | POA: Diagnosis not present

## 2020-04-14 DIAGNOSIS — K219 Gastro-esophageal reflux disease without esophagitis: Secondary | ICD-10-CM | POA: Diagnosis not present

## 2020-04-14 DIAGNOSIS — E669 Obesity, unspecified: Secondary | ICD-10-CM | POA: Diagnosis not present

## 2020-04-14 DIAGNOSIS — E785 Hyperlipidemia, unspecified: Secondary | ICD-10-CM | POA: Diagnosis not present

## 2020-04-14 DIAGNOSIS — N529 Male erectile dysfunction, unspecified: Secondary | ICD-10-CM | POA: Diagnosis not present

## 2020-04-14 DIAGNOSIS — M1990 Unspecified osteoarthritis, unspecified site: Secondary | ICD-10-CM | POA: Diagnosis not present

## 2020-04-14 DIAGNOSIS — N400 Enlarged prostate without lower urinary tract symptoms: Secondary | ICD-10-CM | POA: Diagnosis not present

## 2020-04-14 DIAGNOSIS — Z6830 Body mass index (BMI) 30.0-30.9, adult: Secondary | ICD-10-CM | POA: Diagnosis not present

## 2020-04-14 DIAGNOSIS — E119 Type 2 diabetes mellitus without complications: Secondary | ICD-10-CM | POA: Diagnosis not present

## 2020-07-06 DIAGNOSIS — R059 Cough, unspecified: Secondary | ICD-10-CM | POA: Diagnosis not present

## 2020-07-06 DIAGNOSIS — J069 Acute upper respiratory infection, unspecified: Secondary | ICD-10-CM | POA: Diagnosis not present

## 2020-07-10 ENCOUNTER — Encounter: Payer: Self-pay | Admitting: Family

## 2020-07-10 ENCOUNTER — Ambulatory Visit (INDEPENDENT_AMBULATORY_CARE_PROVIDER_SITE_OTHER): Payer: Medicare HMO | Admitting: Family

## 2020-07-10 VITALS — BP 111/75 | HR 101 | Temp 98.4°F | Wt 203.0 lb

## 2020-07-10 DIAGNOSIS — J069 Acute upper respiratory infection, unspecified: Secondary | ICD-10-CM | POA: Diagnosis not present

## 2020-07-10 DIAGNOSIS — R059 Cough, unspecified: Secondary | ICD-10-CM

## 2020-07-10 MED ORDER — BENZONATATE 200 MG PO CAPS: 200.0000 mg | ORAL_CAPSULE | Freq: Three times a day (TID) | ORAL | 1 refills | Status: DC | PRN

## 2020-07-10 MED ORDER — AZITHROMYCIN 250 MG PO TABS: ORAL_TABLET | ORAL | 0 refills | Status: DC

## 2020-07-10 MED ORDER — FLUTICASONE PROPIONATE 50 MCG/ACT NA SUSP: 2.0000 | Freq: Every day | NASAL | 6 refills | Status: DC

## 2020-07-10 NOTE — Patient Instructions (Signed)
Upper Respiratory Infection, Adult An upper respiratory infection (URI) is a common viral infection of the nose, throat, and upper air passages that lead to the lungs. The most common type of URI is the common cold. URIs usually get better on their own, without medical treatment. What are the causes? A URI is caused by a virus. You may catch a virus by: Breathing in droplets from an infected person's cough or sneeze. Touching something that has been exposed to the virus (contaminated) and then touching your mouth, nose, or eyes. What increases the risk? You are more likely to get a URI if: You are very young or very old. It is autumn or winter. You have close contact with others, such as at a daycare, school, or health care facility. You smoke. You have long-term (chronic) heart or lung disease. You have a weakened disease-fighting (immune) system. You have nasal allergies or asthma. You are experiencing a lot of stress. You work in an area that has poor air circulation. You have poor nutrition. What are the signs or symptoms? A URI usually involves some of the following symptoms: Runny or stuffy (congested) nose. Sneezing. Cough. Sore throat. Headache. Fatigue. Fever. Loss of appetite. Pain in your forehead, behind your eyes, and over your cheekbones (sinus pain). Muscle aches. Redness or irritation of the eyes. Pressure in the ears or face. How is this diagnosed? This condition may be diagnosed based on your medical history and symptoms, and a physical exam. Your health care provider may use a cotton swab to take a mucus sample from your nose (nasal swab). This sample can be tested to determine what virus is causing the illness. How is this treated? URIs usually get better on their own within 7-10 days. You can take steps at home to relieve your symptoms. Medicines cannot cure URIs, but your health care provider may recommend certain medicines to help relieve symptoms, such  as: Over-the-counter cold medicines. Cough suppressants. Coughing is a type of defense against infection that helps to clear the respiratory system, so take these medicines only as recommended by your health care provider. Fever-reducing medicines. Follow these instructions at home: Activity Rest as needed. If you have a fever, stay home from work or school until your fever is gone or until your health care provider says you are no longer contagious. Your health care provider may have you wear a face mask to prevent your infection from spreading. Relieving symptoms Gargle with a salt-water mixture 3-4 times a day or as needed. To make a salt-water mixture, completely dissolve -1 tsp of salt in 1 cup of warm water. Use a cool-mist humidifier to add moisture to the air. This can help you breathe more easily. Eating and drinking  Drink enough fluid to keep your urine pale yellow. Eat soups and other clear broths. General instructions  Take over-the-counter and prescription medicines only as told by your health care provider. These include cold medicines, fever reducers, and cough suppressants. Do not use any products that contain nicotine or tobacco, such as cigarettes and e-cigarettes. If you need help quitting, ask your health care provider. Stay away from secondhand smoke. Stay up to date on all immunizations, including the yearly (annual) flu vaccine. Keep all follow-up visits as told by your health care provider. This is important. How to prevent the spread of infection to others  URIs can be passed from person to person (are contagious). To prevent the infection from spreading: Wash your hands often with soap and   water. If soap and water are not available, use hand sanitizer. Avoid touching your mouth, face, eyes, or nose. Cough or sneeze into a tissue or your sleeve or elbow instead of into your hand or into the air. Contact a health care provider if: You are getting worse instead  of better. You have a fever or chills. Your mucus is brown or red. You have yellow or brown discharge coming from your nose. You have pain in your face, especially when you bend forward. You have swollen neck glands. You have pain while swallowing. You have white areas in the back of your throat. Get help right away if: You have shortness of breath that gets worse. You have severe or persistent: Headache. Ear pain. Sinus pain. Chest pain. You have chronic lung disease along with any of the following: Wheezing. Prolonged cough. Coughing up blood. A change in your usual mucus. You have a stiff neck. You have changes in your: Vision. Hearing. Thinking. Mood. Summary An upper respiratory infection (URI) is a common infection of the nose, throat, and upper air passages that lead to the lungs. A URI is caused by a virus. URIs usually get better on their own within 7-10 days. Medicines cannot cure URIs, but your health care provider may recommend certain medicines to help relieve symptoms. This information is not intended to replace advice given to you by your health care provider. Make sure you discuss any questions you have with your health care provider. Document Revised: 09/05/2019 Document Reviewed: 09/05/2019 [employer]vier Patient Education  2022 [employer]  

## 2020-07-10 NOTE — Progress Notes (Signed)
Subjective:    Patient ID: Johnathan [name], male    DOB: 16-Sep-1950, 70 y.o.   MRN: [id number]  Chief Complaint  Patient presents with   Cough   Pt presents to the office today with cough and sore throat. He reports he took a home test that was negative for COVID.  Cough This is a new problem. The current episode started in the past 7 days. The problem has been unchanged. The problem occurs every few minutes. The cough is Non-productive. Associated symptoms include nasal congestion, postnasal drip and a sore throat. Pertinent negatives include no chills, ear congestion, ear pain, fever, headaches, rhinorrhea, shortness of breath or wheezing. The symptoms are aggravated by lying down. He has tried rest (cough drops) for the symptoms. The treatment provided mild relief. There is no history of COPD or environmental allergies.     Review of Systems  Constitutional:  Negative for chills and fever.  HENT:  Positive for postnasal drip and sore throat. Negative for ear pain and rhinorrhea.   Respiratory:  Positive for cough. Negative for shortness of breath and wheezing.   Allergic/Immunologic: Negative for environmental allergies.  Neurological:  Negative for headaches.  All other systems reviewed and are negative.     Objective:   Physical Exam Vitals reviewed.  Constitutional:      General: He is not in acute distress.    Appearance: He is well-developed.  HENT:     Head: Normocephalic.     Right Ear: Tympanic membrane normal.     Left Ear: Tympanic membrane normal.  Eyes:     General:        Right eye: No discharge.        Left eye: No discharge.     Pupils: Pupils are equal, round, and reactive to light.  Neck:     Thyroid: No thyromegaly.  Cardiovascular:     Rate and Rhythm: Normal rate and regular rhythm.     Heart sounds: Normal heart sounds. No murmur heard. Pulmonary:     Effort: Pulmonary effort is normal. No respiratory distress.     Breath sounds: Normal breath  sounds. No wheezing.  Abdominal:     General: Bowel sounds are normal. There is no distension.     Palpations: Abdomen is soft.     Tenderness: There is no abdominal tenderness.  Musculoskeletal:        General: No tenderness. Normal range of motion.     Cervical back: Normal range of motion and neck supple.  Skin:    General: Skin is warm and dry.     Findings: No erythema or rash.  Neurological:     Mental Status: He is alert and oriented to person, place, and time.     Cranial Nerves: No cranial nerve deficit.     Deep Tendon Reflexes: Reflexes are normal and symmetric.  Psychiatric:        Behavior: Behavior normal.        Thought Content: Thought content normal.        Judgment: Judgment normal.      BP 111/75   Pulse (!) 101   Temp 98.4 F (36.9 C) (Temporal)   Wt 203 lb (92.1 kg)   SpO2 95%   BMI 30.87 kg/m      Assessment & Plan:  Johnathan [name] comes in today with chief complaint of Cough   Diagnosis and orders addressed:  1. Cough - Novel Coronavirus, NAA ([facility]) - azithromycin (  ZITHROMAX) 250 MG tablet; Take 500 mg once, then 250 mg for four days  Dispense: 6 tablet; Refill: 0 - fluticasone (FLONASE) 50 MCG/ACT nasal spray; Place 2 sprays into both nostrils daily.  Dispense: 16 g; Refill: 6 - benzonatate (TESSALON) 200 MG capsule; Take 1 capsule (200 mg total) by mouth 3 (three) times daily as needed.  Dispense: 30 capsule; Refill: 1  2. Viral URI with cough - Take meds as prescribed - Use a cool mist humidifier  -Use saline nose sprays frequently -Force fluids -For any cough or congestion  Use plain Mucinex- regular strength or max strength is fine -For fever or aces or pains- take tylenol or ibuprofen. -Throat lozenges if help -RTO if symptoms worsen or do not improve  - Novel Coronavirus, NAA ([facility]) - fluticasone (FLONASE) 50 MCG/ACT nasal spray; Place 2 sprays into both nostrils daily.  Dispense: 16 g; Refill: 6 - benzonatate (TESSALON)  200 MG capsule; Take 1 capsule (200 mg total) by mouth 3 (three) times daily as needed.  Dispense: 30 capsule; Refill: [place], FNP

## 2020-07-11 LAB — NOVEL CORONAVIRUS, NAA: SARS-CoV-2, NAA: NOT DETECTED

## 2020-07-19 ENCOUNTER — Other Ambulatory Visit: Payer: Self-pay | Admitting: Family Medicine

## 2020-07-19 DIAGNOSIS — I10 Essential (primary) hypertension: Secondary | ICD-10-CM

## 2020-07-29 ENCOUNTER — Other Ambulatory Visit: Payer: Self-pay | Admitting: Family Medicine

## 2020-07-29 DIAGNOSIS — E785 Hyperlipidemia, unspecified: Secondary | ICD-10-CM

## 2020-07-29 DIAGNOSIS — I10 Essential (primary) hypertension: Secondary | ICD-10-CM

## 2020-08-11 ENCOUNTER — Other Ambulatory Visit: Payer: Self-pay | Admitting: Family Medicine

## 2020-08-11 DIAGNOSIS — K219 Gastro-esophageal reflux disease without esophagitis: Secondary | ICD-10-CM

## 2020-08-15 ENCOUNTER — Other Ambulatory Visit: Payer: Self-pay | Admitting: Family Medicine

## 2020-08-15 DIAGNOSIS — I10 Essential (primary) hypertension: Secondary | ICD-10-CM

## 2020-08-31 ENCOUNTER — Other Ambulatory Visit: Payer: Medicare HMO

## 2020-08-31 ENCOUNTER — Other Ambulatory Visit: Payer: Self-pay

## 2020-08-31 DIAGNOSIS — R739 Hyperglycemia, unspecified: Secondary | ICD-10-CM | POA: Diagnosis not present

## 2020-08-31 DIAGNOSIS — E785 Hyperlipidemia, unspecified: Secondary | ICD-10-CM | POA: Diagnosis not present

## 2020-08-31 DIAGNOSIS — I10 Essential (primary) hypertension: Secondary | ICD-10-CM

## 2020-08-31 LAB — BAYER DCA HB A1C WAIVED: HB A1C (BAYER DCA - WAIVED): 6.2 % (ref <7.0)

## 2020-09-01 ENCOUNTER — Ambulatory Visit (INDEPENDENT_AMBULATORY_CARE_PROVIDER_SITE_OTHER): Payer: Medicare HMO

## 2020-09-01 VITALS — Ht 68.0 in | Wt 200.0 lb

## 2020-09-01 DIAGNOSIS — Z0000 Encounter for general adult medical examination without abnormal findings: Secondary | ICD-10-CM

## 2020-09-01 NOTE — Patient Instructions (Signed)
Johnathan [name] , Thank you for taking time to come for your Medicare Wellness Visit. I appreciate your ongoing commitment to your health goals. Please review the following plan we discussed and let me know if I can assist you in the future.   Screening recommendations/referrals: Colonoscopy: Done 08/15/2013 - Repeat in 10 years Recommended yearly ophthalmology/optometry visit for glaucoma screening and checkup Recommended yearly dental visit for hygiene and checkup  Vaccinations: Influenza vaccine: Done 2021 - Repeat every fall Pneumococcal vaccine: Done 01/16/2004, Prevnar obtained at [facility] - we need records please Tdap vaccine: Done 12/20/2018 - Repeat in 10 years Shingles vaccine: Due? - We need records if obtained at [facility]: Done 04/01/2019, 04/22/2019, & 08/03/2020  Advanced directives: Please bring a copy of your health care power of attorney and living will to the office to be added to your chart at your convenience.   Conditions/risks identified: Aim for 30 minutes of exercise or brisk walking each day, drink 6-8 glasses of water and eat lots of fruits and vegetables.   Next appointment: Follow up in one year for your annual wellness visit.   Preventive Care 12 Years and Older, Male  Preventive care refers to lifestyle choices and visits with your health care provider that can promote health and wellness. What does preventive care include? A yearly physical exam. This is also called an annual well check. Dental exams once or twice a year. Routine eye exams. Ask your health care provider how often you should have your eyes checked. Personal lifestyle choices, including: Daily care of your teeth and gums. Regular physical activity. Eating a healthy diet. Avoiding tobacco and drug use. Limiting alcohol use. Practicing safe sex. Taking low doses of aspirin every day. Taking vitamin and mineral supplements as recommended by your health care provider. What happens during an annual  well check? The services and screenings done by your health care provider during your annual well check will depend on your age, overall health, lifestyle risk factors, and family history of disease. Counseling  Your health care provider may ask you questions about your: Alcohol use. Tobacco use. Drug use. Emotional well-being. Home and relationship well-being. Sexual activity. Eating habits. History of falls. Memory and ability to understand (cognition). Work and work Statistician. Screening  You may have the following tests or measurements: Height, weight, and BMI. Blood pressure. Lipid and cholesterol levels. These may be checked every 5 years, or more frequently if you are over 24 years old. Skin check. Lung cancer screening. You may have this screening every year starting at age 64 if you have a 30-pack-year history of smoking and currently smoke or have quit within the past 15 years. Fecal occult blood test (FOBT) of the stool. You may have this test every year starting at age 37. Flexible sigmoidoscopy or colonoscopy. You may have a sigmoidoscopy every 5 years or a colonoscopy every 10 years starting at age 22. Prostate cancer screening. Recommendations will vary depending on your family history and other risks. Hepatitis C blood test. Hepatitis B blood test. Sexually transmitted disease (STD) testing. Diabetes screening. This is done by checking your blood sugar (glucose) after you have not eaten for a while (fasting). You may have this done every 1-3 years. Abdominal aortic aneurysm (AAA) screening. You may need this if you are a current or former smoker. Osteoporosis. You may be screened starting at age 31 if you are at high risk. Talk with your health care provider about your test results, treatment  options, and if necessary, the need for more tests. Vaccines  Your health care provider may recommend certain vaccines, such as: Influenza vaccine. This is recommended every  year. Tetanus, diphtheria, and acellular pertussis (Tdap, Td) vaccine. You may need a Td booster every 10 years. Zoster vaccine. You may need this after age 51. Pneumococcal 13-valent conjugate (PCV13) vaccine. One dose is recommended after age 69. Pneumococcal polysaccharide (PPSV23) vaccine. One dose is recommended after age 11. Talk to your health care provider about which screenings and vaccines you need and how often you need them. This information is not intended to replace advice given to you by your health care provider. Make sure you discuss any questions you have with your health care provider. Document Released: 01/23/2015 Document Revised: 09/16/2015 Document Reviewed: 10/28/2014 Elsevier Interactive Patient Education  2017 [place] Prevention in the Home Falls can cause injuries. They can happen to people of all ages. There are many things you can do to make your home safe and to help prevent falls. What can I do on the outside of my home? Regularly fix the edges of walkways and driveways and fix any cracks. Remove anything that might make you trip as you walk through a door, such as a raised step or threshold. Trim any bushes or trees on the path to your home. Use bright outdoor lighting. Clear any walking paths of anything that might make someone trip, such as rocks or tools. Regularly check to see if handrails are loose or broken. Make sure that both sides of any steps have handrails. Any raised decks and porches should have guardrails on the edges. Have any leaves, snow, or ice cleared regularly. Use sand or salt on walking paths during winter. Clean up any spills in your garage right away. This includes oil or grease spills. What can I do in the bathroom? Use night lights. Install grab bars by the toilet and in the tub and shower. Do not use towel bars as grab bars. Use non-skid mats or decals in the tub or shower. If you need to sit down in the shower, use a  plastic, non-slip stool. Keep the floor dry. Clean up any water that spills on the floor as soon as it happens. Remove soap buildup in the tub or shower regularly. Attach bath mats securely with double-sided non-slip rug tape. Do not have throw rugs and other things on the floor that can make you trip. What can I do in the bedroom? Use night lights. Make sure that you have a light by your bed that is easy to reach. Do not use any sheets or blankets that are too big for your bed. They should not hang down onto the floor. Have a firm chair that has side arms. You can use this for support while you get dressed. Do not have throw rugs and other things on the floor that can make you trip. What can I do in the kitchen? Clean up any spills right away. Avoid walking on wet floors. Keep items that you use a lot in easy-to-reach places. If you need to reach something above you, use a strong step stool that has a grab bar. Keep electrical cords out of the way. Do not use floor polish or wax that makes floors slippery. If you must use wax, use non-skid floor wax. Do not have throw rugs and other things on the floor that can make you trip. What can I do with my stairs? Do not  leave any items on the stairs. Make sure that there are handrails on both sides of the stairs and use them. Fix handrails that are broken or loose. Make sure that handrails are as long as the stairways. Check any carpeting to make sure that it is firmly attached to the stairs. Fix any carpet that is loose or worn. Avoid having throw rugs at the top or bottom of the stairs. If you do have throw rugs, attach them to the floor with carpet tape. Make sure that you have a light switch at the top of the stairs and the bottom of the stairs. If you do not have them, ask someone to add them for you. What else can I do to help prevent falls? Wear shoes that: Do not have high heels. Have rubber bottoms. Are comfortable and fit you  well. Are closed at the toe. Do not wear sandals. If you use a stepladder: Make sure that it is fully opened. Do not climb a closed stepladder. Make sure that both sides of the stepladder are locked into place. Ask someone to hold it for you, if possible. Clearly mark and make sure that you can see: Any grab bars or handrails. First and last steps. Where the edge of each step is. Use tools that help you move around (mobility aids) if they are needed. These include: Canes. Walkers. Scooters. Crutches. Turn on the lights when you go into a dark area. Replace any light bulbs as soon as they burn out. Set up your furniture so you have a clear path. Avoid moving your furniture around. If any of your floors are uneven, fix them. If there are any pets around you, be aware of where they are. Review your medicines with your doctor. Some medicines can make you feel dizzy. This can increase your chance of falling. Ask your doctor what other things that you can do to help prevent falls. This information is not intended to replace advice given to you by your health care provider. Make sure you discuss any questions you have with your health care provider. Document Released: 10/23/2008 Document Revised: 06/04/2015 Document Reviewed: 01/31/2014 Elsevier Interactive Patient Education  2017 [employer].

## 2020-09-01 NOTE — Progress Notes (Signed)
Subjective:   Johnathan [name] is a 70 y.o. male who presents for Medicare Annual/Subsequent preventive examination.  Virtual Visit via Telephone Note  I connected with  RUTH ENGELHARD on 09/01/20 at  3:30 PM EDT by telephone and verified that I am speaking with the correct person using two identifiers.  Location: Patient: Home Provider: [facility] Persons participating in the virtual visit: patient/Nurse Health Advisor   I discussed the limitations, risks, security and privacy concerns of performing an evaluation and management service by telephone and the availability of in person appointments. The patient expressed understanding and agreed to proceed.  Interactive audio and video telecommunications were attempted between this nurse and patient, however failed, due to patient having technical difficulties OR patient did not have access to video capability.  We continued and completed visit with audio only.  Some vital signs may be absent or patient reported.   Filippa Yarbough E Skip Litke, LPN    Review of Systems     Cardiac Risk Factors include: advanced age (>1mn, >>72women);male gender;obesity (BMI >30kg/m2);sedentary lifestyle;diabetes mellitus;dyslipidemia;hypertension     Objective:    Today's Vitals   09/01/20 1533  Weight: 200 lb (90.7 kg)  Height: '5\' 8"'$  (1.727 m)   Body mass index is 30.41 kg/m.  Advanced Directives 09/01/2020 12/20/2018 07/06/2018 12/28/2015 12/22/2015  Does Patient Have a Medical Advance Directive? Yes Yes No Yes Yes  Type of [place] will - Living will Living will  Does patient want to make changes to medical advance directive? - - - No - Patient declined -  Copy of [place]? No - copy requested - - - -  Would patient like information on creating a medical advance directive? - - Yes (MAU/Ambulatory/Procedural Areas - Information given) - -     Current Medications (verified) Outpatient Encounter Medications as of 09/01/2020  Medication Sig   Glucosamine-Chondroit-Vit C-Mn (GLUCOSAMINE 1500 COMPLEX PO) Take by mouth.   hydrochlorothiazide (HYDRODIURIL) 25 MG tablet TAKE 1 TABLET BY MOUTH EVERY DAY   lisinopril (ZESTRIL) 40 MG tablet Take 1 tablet (40 mg total) by mouth daily.   omeprazole (PRILOSEC) 40 MG capsule TAKE 1 CAPSULE BY MOUTH EVERY DAY   pravastatin (PRAVACHOL) 40 MG tablet TAKE 1 TABLET BY MOUTH EVERY DAY   sildenafil (REVATIO) 20 MG tablet Take 1-5 tablets (20-100 mg total) by mouth as needed.   tamsulosin (FLOMAX) 0.4 MG CAPS capsule Take 1 capsule (0.4 mg total) by mouth daily.   azithromycin (ZITHROMAX) 250 MG tablet Take 500 mg once, then 250 mg for four days (Patient not taking: Reported on 09/01/2020)   benzonatate (TESSALON) 200 MG capsule Take 1 capsule (200 mg total) by mouth 3 (three) times daily as needed. (Patient not taking: Reported on 09/01/2020)   fluticasone (FLONASE) 50 MCG/ACT nasal spray Place 2 sprays into both nostrils daily. (Patient not taking: Reported on 09/01/2020)   methylPREDNISolone (MEDROL DOSEPAK) 4 MG TBPK tablet Use as directed. (Patient not taking: Reported on 09/01/2020)   No facility-administered encounter medications on file as of 09/01/2020.    Allergies (verified) Patient has no known allergies.   History: Past Medical History:  Diagnosis Date   GERD (gastroesophageal reflux disease)    Hyperlipidemia    Hypertension    Past Surgical History:  Procedure Laterality Date   CATARACT EXTRACTION     EXCISION OF SKIN TAG N/A 12/28/2015   Procedure: EXCISION OF ANAL  SKIN  TAGS AND 3 SKIN TAGS LEFT THIGH;  Surgeon: Georganna Skeans, MD;  Location: [place];  Service: General;  Laterality: N/A;   HEMORRHOID SURGERY  12/28/2015   Procedure: HEMORRHOIDECTOMY x2;  Surgeon: Georganna Skeans, MD;  Location: [place];  Service: General;;   POLYPECTOMY      from vocal cords   Family History  Problem Relation Age of Onset   Cancer Mother        breast   Hepatitis C Brother    Social History   Socioeconomic History   Marital status: Married    Spouse name: Caren Griffins   Number of children: 2   Years of education: Not on file   Highest education level: 12th grade  Occupational History   Occupation: Retired  Tobacco Use   Smoking status: Never   Smokeless tobacco: Never  Vaping Use   Vaping Use: Never used  Substance and Sexual Activity   Alcohol use: No   Drug use: No   Sexual activity: Yes  Other Topics Concern   Not on file  Social History Narrative   Lives home with wife - gets VA benefits    Children live nearby   Social Determinants of Health   Financial Resource Strain: Low Risk    Difficulty of Paying Living Expenses: Not hard at all  Food Insecurity: No Food Insecurity   Worried About Charity fundraiser in the Last Year: Never true   Arboriculturist in the Last Year: Never true  Transportation Needs: No Transportation Needs   Lack of Transportation (Medical): No   Lack of Transportation (Non-Medical): No  Physical Activity: Insufficiently Active   Days of Exercise per Week: 3 days   Minutes of Exercise per Session: 30 min  Stress: No Stress Concern Present   Feeling of Stress : Not at all  Social Connections: Socially Integrated   Frequency of Communication with Friends and Family: More than three times a week   Frequency of Social Gatherings with Friends and Family: More than three times a week   Attends Religious Services: More than 4 times per year   Active Member of [employer] or Organizations: Yes   Attends Music therapist: More than 4 times per year   Marital Status: Married    Tobacco Counseling Counseling given: Not Answered   Clinical Intake:  Pre-visit preparation completed: Yes  Pain : No/denies pain     BMI - recorded: 30.41 Nutritional Status: BMI > 30  Obese Nutritional  Risks: None Diabetes: Yes CBG done?: No Did pt. bring in CBG monitor from home?: No  How often do you need to have someone help you when you read instructions, pamphlets, or other written materials from your doctor or pharmacy?: 1 - Never  Diabetic? (Pre-diabetic)  Interpreter Needed?: No  Information entered by :: Julliana Whitmyer, LPN   Activities of Daily Living In your present state of health, do you have any difficulty performing the following activities: 09/01/2020  Hearing? Y  Comment wears hearing aids  Vision? N  Difficulty concentrating or making decisions? N  Walking or climbing stairs? N  Dressing or bathing? N  Doing errands, shopping? N  Preparing Food and eating ? N  Using the Toilet? N  In the past six months, have you accidently leaked urine? N  Do you have problems with loss of bowel control? N  Managing your Medications? N  Managing your Finances? N  Housekeeping or managing your  Housekeeping? N  Some recent data might be hidden    Patient Care Team: Dettinger, Fransisca Kaufmann, MD as PCP - General (Family Medicine)  Indicate any recent Medical Services you may have received from other than [facility] providers in the past year (date may be approximate).     Assessment:   This is a routine wellness examination for Johnathan [name].  Hearing/Vision screen Hearing Screening - Comments:: C/o moderate hearing difficulties - wears hearing aids Vision Screening - Comments:: Wears eyeglasses - up to date with [facility]  Dietary issues and exercise activities discussed: Current Exercise Habits: Structured exercise class, Type of exercise: strength training/weights;treadmill, Time (Minutes): 30, Frequency (Times/Week): 3, Weekly Exercise (Minutes/Week): 90, Intensity: Moderate, Exercise limited by: None identified (Goes to [place] [facility] the [employer] in [place])   Goals Addressed             This Visit's Progress    Exercise 3x per week (30 min per time)   On  track    Try to exercise for at least 30 min, 3 times weekly       Depression Screen PHQ 2/9 Scores 09/01/2020 06/19/2019 11/22/2018 07/06/2018 10/16/2017 12/28/2016 06/27/2016  PHQ - 2 Score 0 0 0 0 0 0 0    Fall Risk Fall Risk  09/01/2020 06/19/2019 11/22/2018 07/06/2018 10/16/2017  Falls in the past year? 0 1 1 0 No  Number falls in past yr: 0 1 0 0 -  Injury with Fall? 0 0 1 0 -  Risk for fall due to : Impaired vision History of fall(s) - - -  Follow up Falls prevention discussed Falls evaluation completed - - -    FALL RISK PREVENTION PERTAINING TO THE HOME:  Any stairs in or around the home? Yes  If so, are there any without handrails? No  Home free of loose throw rugs in walkways, pet beds, electrical cords, etc? Yes  Adequate lighting in your home to reduce risk of falls? Yes   ASSISTIVE DEVICES UTILIZED TO PREVENT FALLS:  Life alert? No  Use of a cane, walker or w/c? No  Grab bars in the bathroom? No  Shower chair or bench in shower? No  Elevated toilet seat or a handicapped toilet? No   TIMED UP AND GO:  Was the test performed? No . Telephonic visit.  Cognitive Function: Normal cognitive status assessed by direct observation by this Nurse Health Advisor. No abnormalities found.      6CIT Screen 07/06/2018  What Year? 0 points  What month? 0 points  What time? 0 points  Count back from 20 0 points  Months in reverse 0 points  Repeat phrase 0 points  Total Score 0    Immunizations Immunization History  Administered Date(s) Administered   DTaP 01/11/2003   Influenza-Unspecified 10/16/2000   Tdap 12/20/2018    TDAP status: Up to date  Flu Vaccine status: Up to date  Pneumococcal vaccine status: Up to date  Covid-19 vaccine status: Completed vaccines  Qualifies for Shingles Vaccine? Yes   Zostavax completed No   Shingrix Completed?: No.    Education has been provided regarding the importance of this vaccine. Patient has been advised to call insurance  company to determine out of pocket expense if they have not yet received this vaccine. Advised may also receive vaccine at local pharmacy or Health Dept. Verbalized acceptance and understanding.  Screening Tests Health Maintenance  Topic Date Due   COVID-19 Vaccine (1) Never done  FOOT EXAM  Never done   OPHTHALMOLOGY EXAM  Never done   Zoster Vaccines- Shingrix (1 of 2) Never done   PNA vac Low Risk Adult (1 of 2 - PCV13) Never done   INFLUENZA VACCINE  08/10/2020   HEMOGLOBIN A1C  03/03/2021   COLONOSCOPY (Pts 45-16yr Insurance coverage will need to be confirmed)  08/16/2023   TETANUS/TDAP  12/19/2028   Hepatitis C Screening  Completed   HPV VACCINES  Aged Out    Health Maintenance  Health Maintenance Due  Topic Date Due   COVID-19 Vaccine (1) Never done   FOOT EXAM  Never done   OPHTHALMOLOGY EXAM  Never done   Zoster Vaccines- Shingrix (1 of 2) Never done   PNA vac Low Risk Adult (1 of 2 - PCV13) Never done   INFLUENZA VACCINE  08/10/2020    Colorectal cancer screening: Type of screening: Colonoscopy. Completed 08/15/2013. Repeat every 10 years  Lung Cancer Screening: (Low Dose CT Chest recommended if Age 71-80years, 30 pack-year currently smoking OR have quit w/in 15years.) does not qualify.   Additional Screening:  Hepatitis C Screening: does qualify; Completed 06/27/2016  Vision Screening: Recommended annual ophthalmology exams for early detection of glaucoma and other disorders of the eye. Is the patient up to date with their annual eye exam?  Yes  Who is the provider or what is the name of the office in which the patient attends annual eye exams[facility] provider, would they like to be referred to a provider to establish care? No .   Dental Screening: Recommended annual dental exams for proper oral hygiene  Community Resource Referral / Chronic Care Management: CRR required this visit?  No   CCM required this visit?  No       Plan:     I have personally reviewed and noted the following in the patient's chart:   Medical and social history Use of alcohol, tobacco or illicit drugs  Current medications and supplements including opioid prescriptions. Patient is not currently taking opioid prescriptions. Functional ability and status Nutritional status Physical activity Advanced directives List of other physicians Hospitalizations, surgeries, and ER visits in previous 12 months Vitals Screenings to include cognitive, depression, and falls Referrals and appointments  In addition, I have reviewed and discussed with patient certain preventive protocols, quality metrics, and best practice recommendations. A written personalized care plan for preventive services as well as general preventive health recommendations were provided to patient.     ASandrea Hammond LPN   [id number]  Nurse Notes: None

## 2020-09-03 LAB — CBC WITH DIFFERENTIAL/PLATELET
Basophils Absolute: 0 10*3/uL (ref 0.0–0.2)
Basos: 1 %
EOS (ABSOLUTE): 0.1 10*3/uL (ref 0.0–0.4)
Eos: 2 %
Hematocrit: 45.5 % (ref 37.5–51.0)
Hemoglobin: 16 g/dL (ref 13.0–17.7)
Immature Grans (Abs): 0 10*3/uL (ref 0.0–0.1)
Immature Granulocytes: 0 %
Lymphocytes Absolute: 1.8 10*3/uL (ref 0.7–3.1)
Lymphs: 34 %
MCH: 31.8 pg (ref 26.6–33.0)
MCHC: 35.2 g/dL (ref 31.5–35.7)
MCV: 91 fL (ref 79–97)
Monocytes Absolute: 0.4 10*3/uL (ref 0.1–0.9)
Monocytes: 8 %
Neutrophils Absolute: 3 10*3/uL (ref 1.4–7.0)
Neutrophils: 55 %
Platelets: 160 10*3/uL (ref 150–450)
RBC: 5.03 x10E6/uL (ref 4.14–5.80)
RDW: 11.5 % — ABNORMAL LOW (ref 11.6–15.4)
WBC: 5.4 10*3/uL (ref 3.4–10.8)

## 2020-09-03 LAB — CMP14+EGFR
ALT: 13 IU/L (ref 0–44)
AST: 18 IU/L (ref 0–40)
Albumin/Globulin Ratio: 1.7 (ref 1.2–2.2)
Albumin: 4.7 g/dL (ref 3.7–4.7)
Alkaline Phosphatase: 78 IU/L (ref 44–121)
BUN/Creatinine Ratio: 10 (ref 10–24)
BUN: 11 mg/dL (ref 8–27)
Bilirubin Total: 0.4 mg/dL (ref 0.0–1.2)
CO2: 19 mmol/L — ABNORMAL LOW (ref 20–29)
Calcium: 9.4 mg/dL (ref 8.6–10.2)
Chloride: 102 mmol/L (ref 96–106)
Creatinine, Ser: 1.08 mg/dL (ref 0.76–1.27)
Globulin, Total: 2.8 g/dL (ref 1.5–4.5)
Glucose: 145 mg/dL — ABNORMAL HIGH (ref 65–99)
Potassium: 3.4 mmol/L — ABNORMAL LOW (ref 3.5–5.2)
Sodium: 140 mmol/L (ref 134–144)
Total Protein: 7.5 g/dL (ref 6.0–8.5)
eGFR: 73 mL/min/{1.73_m2} (ref >59)

## 2020-09-03 LAB — TESTOSTERONE,FREE AND TOTAL
Testosterone, Free: 5.7 pg/mL — ABNORMAL LOW (ref 6.6–18.1)
Testosterone: 408 ng/dL (ref 264–916)

## 2020-09-03 LAB — LIPID PANEL
Chol/HDL Ratio: 3.9 ratio (ref 0.0–5.0)
Cholesterol, Total: 146 mg/dL (ref 100–199)
HDL: 37 mg/dL — ABNORMAL LOW (ref >39)
LDL Chol Calc (NIH): 86 mg/dL (ref 0–99)
Triglycerides: 125 mg/dL (ref 0–149)
VLDL Cholesterol Cal: 23 mg/dL (ref 5–40)

## 2020-09-07 ENCOUNTER — Encounter: Payer: Self-pay | Admitting: Family Medicine

## 2020-09-07 ENCOUNTER — Other Ambulatory Visit: Payer: Self-pay

## 2020-09-07 ENCOUNTER — Ambulatory Visit (INDEPENDENT_AMBULATORY_CARE_PROVIDER_SITE_OTHER): Payer: Medicare HMO | Admitting: Family Medicine

## 2020-09-07 VITALS — BP 119/83 | HR 91 | Temp 97.7°F | Ht 68.0 in | Wt 201.4 lb

## 2020-09-07 DIAGNOSIS — I10 Essential (primary) hypertension: Secondary | ICD-10-CM

## 2020-09-07 DIAGNOSIS — N401 Enlarged prostate with lower urinary tract symptoms: Secondary | ICD-10-CM | POA: Diagnosis not present

## 2020-09-07 DIAGNOSIS — Z0000 Encounter for general adult medical examination without abnormal findings: Secondary | ICD-10-CM

## 2020-09-07 DIAGNOSIS — Z0001 Encounter for general adult medical examination with abnormal findings: Secondary | ICD-10-CM

## 2020-09-07 DIAGNOSIS — E785 Hyperlipidemia, unspecified: Secondary | ICD-10-CM

## 2020-09-07 DIAGNOSIS — R7303 Prediabetes: Secondary | ICD-10-CM

## 2020-09-07 DIAGNOSIS — R3914 Feeling of incomplete bladder emptying: Secondary | ICD-10-CM

## 2020-09-07 NOTE — Progress Notes (Signed)
BP 119/83   Pulse 91   Temp 97.7 F (36.5 C) (Temporal)   Ht 5\' 8"  (1.727 m)   Wt 201 lb 6.4 oz (91.4 kg)   SpO2 98%   BMI 30.62 kg/m    Subjective:   Patient ID: Johnathan [name], male    DOB: 07/28/1950, 70 y.o.   MRN: [id number]  HPI: Johnathan [name] is a 70 y.o. male presenting on 09/07/2020 for Annual Exam   HPI Adult well exam and physical and recheck of chronic medical issues Patient denies any chest pain, shortness of breath, headaches or vision issues, abdominal complaints, diarrhea, nausea, vomiting, or joint issues.  Patient does have yellow and thickened toenails especially on the left great toenail and wants note that anything can be poor.  He has tried a topical cream for a few months without much success.  Hypertension Patient is currently on hydrochlorothiazide and lisinopril, and their blood pressure today is 119/83. Patient denies any lightheadedness or dizziness. Patient denies headaches, blurred vision, chest pains, shortness of breath, or weakness. Denies any side effects from medication and is content with current medication.   Hyperlipidemia Patient is coming in for recheck of his hyperlipidemia. The patient is currently taking pravastatin. They deny any issues with myalgias or history of liver damage from it. They deny any focal numbness or weakness or chest pain.   GERD Patient is currently on omeprazole.  She denies any major symptoms or abdominal pain or belching or burping. She denies any blood in her stool or lightheadedness or dizziness.   Relevant past medical, surgical, family and social history reviewed and updated as indicated. Interim medical history since our last visit reviewed. Allergies and medications reviewed and updated.  Review of Systems  Constitutional:  Negative for chills and fever.  HENT:  Negative for ear pain and tinnitus.   Eyes:  Negative for pain and discharge.  Respiratory:  Negative for cough, shortness of breath and  wheezing.   Cardiovascular:  Negative for chest pain, palpitations and leg swelling.  Gastrointestinal:  Negative for abdominal pain, blood in stool, constipation and diarrhea.  Genitourinary:  Negative for dysuria and hematuria.  Musculoskeletal:  Negative for back pain, gait problem and myalgias.  Skin:  Negative for rash.  Neurological:  Negative for dizziness, weakness and headaches.  Psychiatric/Behavioral:  Negative for suicidal ideas.   All other systems reviewed and are negative.  Per HPI unless specifically indicated above   Allergies as of 09/07/2020   No Known Allergies      Medication List        Accurate as of September 07, 2020  2:45 PM. If you have any questions, ask your nurse or doctor.          STOP taking these medications    azithromycin 250 MG tablet Commonly known as: ZITHROMAX Stopped by: September 09, 2020 Michaelia Beilfuss, MD   benzonatate 200 MG capsule Commonly known as: TESSALON Stopped by: Elige Radon, MD   fluticasone 50 MCG/ACT nasal spray Commonly known as: FLONASE Stopped by: Nils Pyle, MD   methylPREDNISolone 4 MG Tbpk tablet Commonly known as: MEDROL DOSEPAK Stopped by: Nils Pyle Seva Chancy, MD       TAKE these medications    GLUCOSAMINE 1500 COMPLEX PO Take by mouth.   hydrochlorothiazide 25 MG tablet Commonly known as: HYDRODIURIL TAKE 1 TABLET BY MOUTH EVERY DAY   lisinopril 40 MG tablet Commonly known as: ZESTRIL Take 1 tablet (40 mg total) by mouth  daily.   omeprazole 40 MG capsule Commonly known as: PRILOSEC TAKE 1 CAPSULE BY MOUTH EVERY DAY   pravastatin 40 MG tablet Commonly known as: PRAVACHOL TAKE 1 TABLET BY MOUTH EVERY DAY   sildenafil 20 MG tablet Commonly known as: REVATIO Take 1-5 tablets (20-100 mg total) by mouth as needed.   tamsulosin 0.4 MG Caps capsule Commonly known as: FLOMAX Take 1 capsule (0.4 mg total) by mouth daily.         Objective:   BP 119/83   Pulse 91   Temp 97.7 F  (36.5 C) (Temporal)   Ht 5\' 8"  (1.727 m)   Wt 201 lb 6.4 oz (91.4 kg)   SpO2 98%   BMI 30.62 kg/m   Wt Readings from Last 3 Encounters:  09/07/20 201 lb 6.4 oz (91.4 kg)  09/01/20 200 lb (90.7 kg)  07/10/20 203 lb (92.1 kg)    Physical Exam Vitals and nursing note reviewed.  Constitutional:      General: He is not in acute distress.    Appearance: He is well-developed. He is not diaphoretic.  HENT:     Right Ear: External ear normal.     Left Ear: External ear normal.     Nose: Nose normal.     Mouth/Throat:     Pharynx: No oropharyngeal exudate.  Eyes:     General: No scleral icterus.       Right eye: No discharge.     Conjunctiva/sclera: Conjunctivae normal.     Pupils: Pupils are equal, round, and reactive to light.  Neck:     Thyroid: No thyromegaly.  Cardiovascular:     Rate and Rhythm: Normal rate and regular rhythm.     Heart sounds: Normal heart sounds. No murmur heard. Pulmonary:     Effort: Pulmonary effort is normal. No respiratory distress.     Breath sounds: Normal breath sounds. No wheezing.  Abdominal:     General: Bowel sounds are normal. There is no distension.     Palpations: Abdomen is soft.     Tenderness: There is no abdominal tenderness. There is no guarding or rebound.  Genitourinary:    Prostate: Enlarged. Not tender and no nodules present.     Rectum: External hemorrhoid present. No tenderness, anal fissure or internal hemorrhoid.  Musculoskeletal:        General: Normal range of motion.     Cervical back: Neck supple.  Lymphadenopathy:     Cervical: No cervical adenopathy.  Skin:    General: Skin is warm and dry.     Findings: No rash.  Neurological:     Mental Status: He is alert and oriented to person, place, and time.     Coordination: Coordination normal.  Psychiatric:        Behavior: Behavior normal.    Results for orders placed or performed in visit on 08/31/20  CBC with Differential/Platelet  Result Value Ref Range   WBC  5.4 3.4 - 10.8 x10E3/uL   RBC 5.03 4.14 - 5.80 x10E6/uL   Hemoglobin 16.0 13.0 - 17.7 g/dL   Hematocrit 09/02/20 39.0 - 51.0 %   MCV 91 79 - 97 fL   MCH 31.8 26.6 - 33.0 pg   MCHC 35.2 31.5 - 35.7 g/dL   RDW 56.4 (L) 69.8 - 06.0 %   Platelets 160 150 - 450 x10E3/uL   Neutrophils 55 Not Estab. %   Lymphs 34 Not Estab. %   Monocytes 8 Not Estab. %  Eos 2 Not Estab. %   Basos 1 Not Estab. %   Neutrophils Absolute 3.0 1.4 - 7.0 x10E3/uL   Lymphocytes Absolute 1.8 0.7 - 3.1 x10E3/uL   Monocytes Absolute 0.4 0.1 - 0.9 x10E3/uL   EOS (ABSOLUTE) 0.1 0.0 - 0.4 x10E3/uL   Basophils Absolute 0.0 0.0 - 0.2 x10E3/uL   Immature Granulocytes 0 Not Estab. %   Immature Grans (Abs) 0.0 0.0 - 0.1 x10E3/uL  CMP14+EGFR  Result Value Ref Range   Glucose 145 (H) 65 - 99 mg/dL   BUN 11 8 - 27 mg/dL   Creatinine, Ser 1.08 0.76 - 1.27 mg/dL   eGFR 73 >59 mL/min/1.73   BUN/Creatinine Ratio 10 10 - 24   Sodium 140 134 - 144 mmol/L   Potassium 3.4 (L) 3.5 - 5.2 mmol/L   Chloride 102 96 - 106 mmol/L   CO2 19 (L) 20 - 29 mmol/L   Calcium 9.4 8.6 - 10.2 mg/dL   Total Protein 7.5 6.0 - 8.5 g/dL   Albumin 4.7 3.7 - 4.7 g/dL   Globulin, Total 2.8 1.5 - 4.5 g/dL   Albumin/Globulin Ratio 1.7 1.2 - 2.2   Bilirubin Total 0.4 0.0 - 1.2 mg/dL   Alkaline Phosphatase 78 44 - 121 IU/L   AST 18 0 - 40 IU/L   ALT 13 0 - 44 IU/L  Lipid panel  Result Value Ref Range   Cholesterol, Total 146 100 - 199 mg/dL   Triglycerides 125 0 - 149 mg/dL   HDL 37 (L) >39 mg/dL   VLDL Cholesterol Cal 23 5 - 40 mg/dL   LDL Chol Calc (NIH) 86 0 - 99 mg/dL   Chol/HDL Ratio 3.9 0.0 - 5.0 ratio  Testosterone,Free and Total  Result Value Ref Range   Testosterone 408 264 - 916 ng/dL   Testosterone, Free 5.7 (L) 6.6 - 18.1 pg/mL  Bayer DCA Hb A1c Waived  Result Value Ref Range   HB A1C (BAYER DCA - WAIVED) 6.2 <7.0 %    Assessment & Plan:   Problem List Items Addressed This Visit       Cardiovascular and Mediastinum    Hypertension     Other   Hyperlipidemia LDL goal <130   Prediabetes   Other Visit Diagnoses     Well adult exam    -  Primary   Benign prostatic hyperplasia with incomplete bladder emptying         Continue current medication, blood pressure looks pretty good, continue to focus on diet and lifestyle modification.  He is following up with urology for BPH, currently taking Flomax but still not emptying completely  Follow up plan: Return in about 6 months (around 03/09/2021), or if symptoms worsen or fail to improve, for Hypertension and prostate.  Counseling provided for all of the vaccine components No orders of the defined types were placed in this encounter.   Caryl Pina, MD [facility] 09/07/2020, 2:45 PM

## 2020-09-09 ENCOUNTER — Other Ambulatory Visit: Payer: Self-pay | Admitting: Family Medicine

## 2020-09-09 DIAGNOSIS — I10 Essential (primary) hypertension: Secondary | ICD-10-CM

## 2020-10-18 ENCOUNTER — Other Ambulatory Visit: Payer: Self-pay | Admitting: Family Medicine

## 2020-10-18 DIAGNOSIS — N401 Enlarged prostate with lower urinary tract symptoms: Secondary | ICD-10-CM

## 2020-10-23 ENCOUNTER — Other Ambulatory Visit: Payer: Self-pay | Admitting: Family Medicine

## 2020-10-23 DIAGNOSIS — E785 Hyperlipidemia, unspecified: Secondary | ICD-10-CM

## 2020-10-29 ENCOUNTER — Other Ambulatory Visit: Payer: Self-pay | Admitting: Family Medicine

## 2020-10-29 DIAGNOSIS — I10 Essential (primary) hypertension: Secondary | ICD-10-CM

## 2020-11-06 ENCOUNTER — Other Ambulatory Visit: Payer: Self-pay | Admitting: Family Medicine

## 2020-11-06 DIAGNOSIS — K219 Gastro-esophageal reflux disease without esophagitis: Secondary | ICD-10-CM

## 2020-11-10 ENCOUNTER — Ambulatory Visit (INDEPENDENT_AMBULATORY_CARE_PROVIDER_SITE_OTHER): Payer: Medicare HMO

## 2020-11-10 ENCOUNTER — Other Ambulatory Visit: Payer: Self-pay

## 2020-11-10 DIAGNOSIS — Z23 Encounter for immunization: Secondary | ICD-10-CM | POA: Diagnosis not present

## 2021-01-07 ENCOUNTER — Other Ambulatory Visit: Payer: Self-pay | Admitting: Family

## 2021-01-07 DIAGNOSIS — J069 Acute upper respiratory infection, unspecified: Secondary | ICD-10-CM

## 2021-01-07 DIAGNOSIS — R059 Cough, unspecified: Secondary | ICD-10-CM

## 2021-01-07 NOTE — Telephone Encounter (Signed)
Not on current med list Last office visit 09/07/20 Last refill in history 07/11/20

## 2021-02-04 ENCOUNTER — Other Ambulatory Visit: Payer: Self-pay | Admitting: Family Medicine

## 2021-02-04 DIAGNOSIS — K219 Gastro-esophageal reflux disease without esophagitis: Secondary | ICD-10-CM

## 2021-03-03 ENCOUNTER — Other Ambulatory Visit: Payer: Self-pay | Admitting: Family Medicine

## 2021-03-03 DIAGNOSIS — I10 Essential (primary) hypertension: Secondary | ICD-10-CM

## 2021-03-10 ENCOUNTER — Telehealth: Payer: Self-pay | Admitting: Family Medicine

## 2021-03-10 DIAGNOSIS — I10 Essential (primary) hypertension: Secondary | ICD-10-CM

## 2021-03-10 DIAGNOSIS — E785 Hyperlipidemia, unspecified: Secondary | ICD-10-CM

## 2021-03-10 NOTE — Telephone Encounter (Signed)
Lab orders placed, patient aware 

## 2021-03-23 ENCOUNTER — Other Ambulatory Visit: Payer: Medicare HMO

## 2021-03-23 DIAGNOSIS — I10 Essential (primary) hypertension: Secondary | ICD-10-CM

## 2021-03-23 DIAGNOSIS — E785 Hyperlipidemia, unspecified: Secondary | ICD-10-CM

## 2021-03-24 LAB — CMP14+EGFR
ALT: 17 IU/L (ref 0–44)
AST: 24 IU/L (ref 0–40)
Albumin/Globulin Ratio: 1.5 (ref 1.2–2.2)
Albumin: 4.6 g/dL (ref 3.7–4.7)
Alkaline Phosphatase: 84 IU/L (ref 44–121)
BUN/Creatinine Ratio: 13 (ref 10–24)
BUN: 17 mg/dL (ref 8–27)
Bilirubin Total: 0.5 mg/dL (ref 0.0–1.2)
CO2: 18 mmol/L — ABNORMAL LOW (ref 20–29)
Calcium: 9.5 mg/dL (ref 8.6–10.2)
Chloride: 100 mmol/L (ref 96–106)
Creatinine, Ser: 1.33 mg/dL — ABNORMAL HIGH (ref 0.76–1.27)
Globulin, Total: 3.1 g/dL (ref 1.5–4.5)
Glucose: 191 mg/dL — ABNORMAL HIGH (ref 70–99)
Potassium: 3.5 mmol/L (ref 3.5–5.2)
Sodium: 139 mmol/L (ref 134–144)
Total Protein: 7.7 g/dL (ref 6.0–8.5)
eGFR: 57 mL/min/{1.73_m2} — ABNORMAL LOW (ref >59)

## 2021-03-24 LAB — CBC WITH DIFFERENTIAL/PLATELET
Basophils Absolute: 0 10*3/uL (ref 0.0–0.2)
Basos: 1 %
EOS (ABSOLUTE): 0.1 10*3/uL (ref 0.0–0.4)
Eos: 1 %
Hematocrit: 47.6 % (ref 37.5–51.0)
Hemoglobin: 16.6 g/dL (ref 13.0–17.7)
Immature Grans (Abs): 0 10*3/uL (ref 0.0–0.1)
Immature Granulocytes: 0 %
Lymphocytes Absolute: 2.4 10*3/uL (ref 0.7–3.1)
Lymphs: 37 %
MCH: 32.1 pg (ref 26.6–33.0)
MCHC: 34.9 g/dL (ref 31.5–35.7)
MCV: 92 fL (ref 79–97)
Monocytes Absolute: 0.5 10*3/uL (ref 0.1–0.9)
Monocytes: 8 %
Neutrophils Absolute: 3.4 10*3/uL (ref 1.4–7.0)
Neutrophils: 53 %
Platelets: 183 10*3/uL (ref 150–450)
RBC: 5.17 x10E6/uL (ref 4.14–5.80)
RDW: 12.3 % (ref 11.6–15.4)
WBC: 6.4 10*3/uL (ref 3.4–10.8)

## 2021-03-24 LAB — LIPID PANEL
Chol/HDL Ratio: 4.2 ratio (ref 0.0–5.0)
Cholesterol, Total: 144 mg/dL (ref 100–199)
HDL: 34 mg/dL — ABNORMAL LOW (ref >39)
LDL Chol Calc (NIH): 84 mg/dL (ref 0–99)
Triglycerides: 145 mg/dL (ref 0–149)
VLDL Cholesterol Cal: 26 mg/dL (ref 5–40)

## 2021-03-26 ENCOUNTER — Other Ambulatory Visit: Payer: Self-pay | Admitting: Family Medicine

## 2021-03-26 DIAGNOSIS — I10 Essential (primary) hypertension: Secondary | ICD-10-CM

## 2021-03-29 ENCOUNTER — Ambulatory Visit (INDEPENDENT_AMBULATORY_CARE_PROVIDER_SITE_OTHER): Payer: Medicare HMO | Admitting: Family Medicine

## 2021-03-29 ENCOUNTER — Encounter: Payer: Self-pay | Admitting: Family Medicine

## 2021-03-29 VITALS — BP 139/84 | HR 104 | Ht 68.0 in | Wt 204.0 lb

## 2021-03-29 DIAGNOSIS — N401 Enlarged prostate with lower urinary tract symptoms: Secondary | ICD-10-CM

## 2021-03-29 DIAGNOSIS — E785 Hyperlipidemia, unspecified: Secondary | ICD-10-CM

## 2021-03-29 DIAGNOSIS — K219 Gastro-esophageal reflux disease without esophagitis: Secondary | ICD-10-CM

## 2021-03-29 DIAGNOSIS — R3912 Poor urinary stream: Secondary | ICD-10-CM | POA: Diagnosis not present

## 2021-03-29 DIAGNOSIS — I10 Essential (primary) hypertension: Secondary | ICD-10-CM | POA: Diagnosis not present

## 2021-03-29 DIAGNOSIS — R7303 Prediabetes: Secondary | ICD-10-CM

## 2021-03-29 LAB — BAYER DCA HB A1C WAIVED: HB A1C (BAYER DCA - WAIVED): 6.4 % — ABNORMAL HIGH (ref 4.8–5.6)

## 2021-03-29 MED ORDER — PRAVASTATIN SODIUM 40 MG PO TABS: 40.0000 mg | ORAL_TABLET | Freq: Every day | ORAL | 3 refills | Status: DC

## 2021-03-29 MED ORDER — LISINOPRIL 40 MG PO TABS: 40.0000 mg | ORAL_TABLET | Freq: Every day | ORAL | 3 refills | Status: DC

## 2021-03-29 MED ORDER — HYDROCHLOROTHIAZIDE 25 MG PO TABS: 25.0000 mg | ORAL_TABLET | Freq: Every day | ORAL | 3 refills | Status: DC

## 2021-03-29 MED ORDER — TAMSULOSIN HCL 0.4 MG PO CAPS: 0.4000 mg | ORAL_CAPSULE | Freq: Every day | ORAL | 1 refills | Status: DC

## 2021-03-29 MED ORDER — OMEPRAZOLE 40 MG PO CPDR: DELAYED_RELEASE_CAPSULE | ORAL | 3 refills | Status: DC

## 2021-03-29 NOTE — Progress Notes (Signed)
? ?BP 139/84   Pulse (!) 104   Ht $R'5\' 8"'qt$  (1.727 m)   Wt 204 lb (92.5 kg)   SpO2 98%   BMI 31.02 kg/m?   ? ?Subjective:  ? ?Patient ID: Johnathan [name], male    DOB: 1950/08/23, 71 y.o.   MRN: [id number] ? ?HPI: ?Johnathan [name] is a 71 y.o. male presenting on 03/29/2021 for Medical Management of Chronic Issues and Hypertension ? ? ?HPI ?Prediabetes  ?patient comes in today for recheck of his diabetes. Patient has been currently taking no medication, his blood sugar was up this time to the 190s.. Patient is currently on an ACE inhibitor/ARB. Patient has not seen an ophthalmologist this year. Patient denies any issues with their feet. The symptom started onset as an adult hypertension and hyperlipidemia ARE RELATED TO DM  ? ?Hypertension ?Patient is currently on lisinopril and hydrochlorothiazide, and their blood pressure today is 139/84. Patient denies any lightheadedness or dizziness. Patient denies headaches, blurred vision, chest pains, shortness of breath, or weakness. Denies any side effects from medication and is content with current medication.  ? ?Hyperlipidemia ?Patient is coming in for recheck of his hyperlipidemia. The patient is currently taking pravastatin. They deny any issues with myalgias or history of liver damage from it. They deny any focal numbness or weakness or chest pain.  ? ?GERD ?Patient is currently on omeprazole.  She denies any major symptoms or abdominal pain or belching or burping. She denies any blood in her stool or lightheadedness or dizziness.  ? ?Relevant past medical, surgical, family and social history reviewed and updated as indicated. Interim medical history since our last visit reviewed. ?Allergies and medications reviewed and updated. ? ?Review of Systems  ?Constitutional:  Negative for chills and fever.  ?Eyes:  Negative for visual disturbance.  ?Respiratory:  Negative for shortness of breath and wheezing.   ?Cardiovascular:  Negative for chest pain and leg swelling.   ?Musculoskeletal:  Negative for back pain and gait problem.  ?Skin:  Negative for rash.  ?Neurological:  Negative for dizziness, weakness and numbness.  ?All other systems reviewed and are negative. ? ?Per HPI unless specifically indicated above ? ? ?Allergies as of 03/29/2021   ?No Known Allergies ?  ? ?  ?Medication List  ?  ? ?  ? Accurate as of March 29, 2021  2:35 PM. If you have any questions, ask your nurse or doctor.  ?  ?  ? ?  ? ?fluticasone 50 MCG/ACT nasal spray ?Commonly known as: FLONASE ?SPRAY 2 SPRAYS INTO EACH NOSTRIL EVERY DAY ?  ?GLUCOSAMINE 1500 COMPLEX PO ?Take by mouth. ?  ?hydrochlorothiazide 25 MG tablet ?Commonly known as: HYDRODIURIL ?Take 1 tablet (25 mg total) by mouth daily. ?  ?lisinopril 40 MG tablet ?Commonly known as: ZESTRIL ?Take 1 tablet (40 mg total) by mouth daily. ?What changed: See the new instructions. ?Changed by: Worthy Rancher, MD ?  ?omeprazole 40 MG capsule ?Commonly known as: PRILOSEC ?TAKE 1 CAPSULE BY MOUTH EVERY DAY ?What changed:  ?how much to take ?how to take this ?when to take this ?additional instructions ?Changed by: Worthy Rancher, MD ?  ?pravastatin 40 MG tablet ?Commonly known as: PRAVACHOL ?Take 1 tablet (40 mg total) by mouth daily. ?  ?sildenafil 20 MG tablet ?Commonly known as: REVATIO ?Take 1-5 tablets (20-100 mg total) by mouth as needed. ?  ?tamsulosin 0.4 MG Caps capsule ?Commonly known as: FLOMAX ?Take 1 capsule (0.4 mg total) by mouth daily. ?  ? ?  ? ? ? ?  Objective:  ? ?BP 139/84   Pulse (!) 104   Ht $R'5\' 8"'Tw$  (1.727 m)   Wt 204 lb (92.5 kg)   SpO2 98%   BMI 31.02 kg/m?   ?Wt Readings from Last 3 Encounters:  ?03/29/21 204 lb (92.5 kg)  ?09/07/20 201 lb 6.4 oz (91.4 kg)  ?09/01/20 200 lb (90.7 kg)  ?  ?Physical Exam ?Vitals and nursing note reviewed.  ?Constitutional:   ?   General: He is not in acute distress. ?   Appearance: He is well-developed. He is not diaphoretic.  ?Eyes:  ?   General: No scleral icterus. ?   Conjunctiva/sclera:  Conjunctivae normal.  ?Neck:  ?   Thyroid: No thyromegaly.  ?Cardiovascular:  ?   Rate and Rhythm: Normal rate and regular rhythm.  ?   Heart sounds: Normal heart sounds. No murmur heard. ?Pulmonary:  ?   Effort: Pulmonary effort is normal. No respiratory distress.  ?   Breath sounds: Normal breath sounds. No wheezing.  ?Musculoskeletal:     ?   General: Normal range of motion.  ?   Cervical back: Neck supple.  ?Lymphadenopathy:  ?   Cervical: No cervical adenopathy.  ?Skin: ?   General: Skin is warm and dry.  ?   Findings: No rash.  ?Neurological:  ?   Mental Status: He is alert and oriented to person, place, and time.  ?   Coordination: Coordination normal.  ?Psychiatric:     ?   Behavior: Behavior normal.  ? ? ?Results for orders placed or performed in visit on 03/23/21  ?Lipid panel  ?Result Value Ref Range  ? Cholesterol, Total 144 100 - 199 mg/dL  ? Triglycerides 145 0 - 149 mg/dL  ? HDL 34 (L) >39 mg/dL  ? VLDL Cholesterol Cal 26 5 - 40 mg/dL  ? LDL Chol Calc (NIH) 84 0 - 99 mg/dL  ? Chol/HDL Ratio 4.2 0.0 - 5.0 ratio  ?CMP14+EGFR  ?Result Value Ref Range  ? Glucose 191 (H) 70 - 99 mg/dL  ? BUN 17 8 - 27 mg/dL  ? Creatinine, Ser 1.33 (H) 0.76 - 1.27 mg/dL  ? eGFR 57 (L) >59 mL/min/1.73  ? BUN/Creatinine Ratio 13 10 - 24  ? Sodium 139 134 - 144 mmol/L  ? Potassium 3.5 3.5 - 5.2 mmol/L  ? Chloride 100 96 - 106 mmol/L  ? CO2 18 (L) 20 - 29 mmol/L  ? Calcium 9.5 8.6 - 10.2 mg/dL  ? Total Protein 7.7 6.0 - 8.5 g/dL  ? Albumin 4.6 3.7 - 4.7 g/dL  ? Globulin, Total 3.1 1.5 - 4.5 g/dL  ? Albumin/Globulin Ratio 1.5 1.2 - 2.2  ? Bilirubin Total 0.5 0.0 - 1.2 mg/dL  ? Alkaline Phosphatase 84 44 - 121 IU/L  ? AST 24 0 - 40 IU/L  ? ALT 17 0 - 44 IU/L  ?CBC with Differential/Platelet  ?Result Value Ref Range  ? WBC 6.4 3.4 - 10.8 x10E3/uL  ? RBC 5.17 4.14 - 5.80 x10E6/uL  ? Hemoglobin 16.6 13.0 - 17.7 g/dL  ? Hematocrit 47.6 37.5 - 51.0 %  ? MCV 92 79 - 97 fL  ? MCH 32.1 26.6 - 33.0 pg  ? MCHC 34.9 31.5 - 35.7 g/dL  ? RDW  12.3 11.6 - 15.4 %  ? Platelets 183 150 - 450 x10E3/uL  ? Neutrophils 53 Not Estab. %  ? Lymphs 37 Not Estab. %  ? Monocytes 8 Not Estab. %  ? Eos 1 Not  Estab. %  ? Basos 1 Not Estab. %  ? Neutrophils Absolute 3.4 1.4 - 7.0 x10E3/uL  ? Lymphocytes Absolute 2.4 0.7 - 3.1 x10E3/uL  ? Monocytes Absolute 0.5 0.1 - 0.9 x10E3/uL  ? EOS (ABSOLUTE) 0.1 0.0 - 0.4 x10E3/uL  ? Basophils Absolute 0.0 0.0 - 0.2 x10E3/uL  ? Immature Granulocytes 0 Not Estab. %  ? Immature Grans (Abs) 0.0 0.0 - 0.1 x10E3/uL  ? ? ?Assessment & Plan:  ? ?Problem List Items Addressed This Visit   ? ?  ? Cardiovascular and Mediastinum  ? Essential hypertension - Primary  ? Relevant Medications  ? hydrochlorothiazide (HYDRODIURIL) 25 MG tablet  ? lisinopril (ZESTRIL) 40 MG tablet  ? pravastatin (PRAVACHOL) 40 MG tablet  ?  ? Digestive  ? GERD (gastroesophageal reflux disease)  ? Relevant Medications  ? omeprazole (PRILOSEC) 40 MG capsule  ?  ? Other  ? Hyperlipidemia LDL goal <130  ? Relevant Medications  ? hydrochlorothiazide (HYDRODIURIL) 25 MG tablet  ? lisinopril (ZESTRIL) 40 MG tablet  ? pravastatin (PRAVACHOL) 40 MG tablet  ? Prediabetes  ? Relevant Orders  ? Bayer DCA Hb A1c Waived  ? ?Other Visit Diagnoses   ? ? Benign prostatic hyperplasia with weak urinary stream      ? Relevant Medications  ? tamsulosin (FLOMAX) 0.4 MG CAPS capsule  ? ?  ?  ?Blood sugar was higher than it has been at 194, we will do an A1c today and then determine from there if we need to start medicines.  Discussed dietary changes and reducing sweet tea and carbohydrates for him ?Follow up plan: ?Return in about 6 months (around 09/29/2021), or if symptoms worsen or fail to improve, for Prediabetes hypertension and cholesterol. ? ?Counseling provided for all of the vaccine components ?Orders Placed This Encounter  ?Procedures  ? Bayer DCA Hb A1c Waived  ? ? ?Caryl Pina, MD ?[place] ?03/29/2021, 2:35 PM ? ? ?  ?

## 2021-09-21 ENCOUNTER — Other Ambulatory Visit: Payer: Self-pay | Admitting: Family Medicine

## 2021-09-21 DIAGNOSIS — N401 Enlarged prostate with lower urinary tract symptoms: Secondary | ICD-10-CM

## 2021-09-24 ENCOUNTER — Ambulatory Visit (INDEPENDENT_AMBULATORY_CARE_PROVIDER_SITE_OTHER): Payer: Medicare HMO

## 2021-09-24 VITALS — Wt 200.0 lb

## 2021-09-24 DIAGNOSIS — Z0000 Encounter for general adult medical examination without abnormal findings: Secondary | ICD-10-CM | POA: Diagnosis not present

## 2021-09-24 NOTE — Patient Instructions (Signed)
Johnathan [name] , Thank you for taking time to come for your Medicare Wellness Visit. I appreciate your ongoing commitment to your health goals. Please review the following plan we discussed and let me know if I can assist you in the future.   Screening recommendations/referrals: Colonoscopy: Keep appointment with [facility] 10/19/2021 Recommended yearly ophthalmology/optometry visit for glaucoma screening and checkup Recommended yearly dental visit for hygiene and checkup  Vaccinations: Influenza vaccine: Done 11/10/2020 - Repeat annually Pneumococcal vaccine: Done at [facility] Tdap vaccine: Done 12/20/2018 -  Repeat in 10 years Shingles vaccine: Done at [facility]: Done 04/01/2019, 05/02/2019, 07/14/2020 - consider booster January 2024 if still recommended at that time  Advanced directives: Please bring a copy of your health care power of attorney and living will to the office to be added to your chart at your convenience.   Conditions/risks identified: Aim for 30 minutes of exercise or brisk walking, 6-8 glasses of water, and 5 servings of fruits and vegetables each day. Try to get [place] your medication list, screenings and vaccine records from the [place].   Next appointment: Follow up in one year for your annual wellness visit.   Preventive Care 72 Years and Older, Male  Preventive care refers to lifestyle choices and visits with your health care provider that can promote health and wellness. What does preventive care include? A yearly physical exam. This is also called an annual well check. Dental exams once or twice a year. Routine eye exams. Ask your health care provider how often you should have your eyes checked. Personal lifestyle choices, including: Daily care of your teeth and gums. Regular physical activity. Eating a healthy diet. Avoiding tobacco and drug use. Limiting alcohol use. Practicing safe sex. Taking low doses of aspirin every day. Taking vitamin and mineral supplements as recommended  by your health care provider. What happens during an annual well check? The services and screenings done by your health care provider during your annual well check will depend on your age, overall health, lifestyle risk factors, and family history of disease. Counseling  Your health care provider may ask you questions about your: Alcohol use. Tobacco use. Drug use. Emotional well-being. Home and relationship well-being. Sexual activity. Eating habits. History of falls. Memory and ability to understand (cognition). Work and work Statistician. Screening  You may have the following tests or measurements: Height, weight, and BMI. Blood pressure. Lipid and cholesterol levels. These may be checked every 5 years, or more frequently if you are over 19 years old. Skin check. Lung cancer screening. You may have this screening every year starting at age 27 if you have a 30-pack-year history of smoking and currently smoke or have quit within the past 15 years. Fecal occult blood test (FOBT) of the stool. You may have this test every year starting at age 79. Flexible sigmoidoscopy or colonoscopy. You may have a sigmoidoscopy every 5 years or a colonoscopy every 10 years starting at age 59. Prostate cancer screening. Recommendations will vary depending on your family history and other risks. Hepatitis C blood test. Hepatitis B blood test. Sexually transmitted disease (STD) testing. Diabetes screening. This is done by checking your blood sugar (glucose) after you have not eaten for a while (fasting). You may have this done every 1-3 years. Abdominal aortic aneurysm (AAA) screening. You may need this if you are a current or former smoker. Osteoporosis. You may be screened starting at age 70 if you are at high risk. Talk with your health  care provider about your test results, treatment options, and if necessary, the need for more tests. Vaccines  Your health care provider may recommend certain  vaccines, such as: Influenza vaccine. This is recommended every year. Tetanus, diphtheria, and acellular pertussis (Tdap, Td) vaccine. You may need a Td booster every 10 years. Zoster vaccine. You may need this after age 71. Pneumococcal 13-valent conjugate (PCV13) vaccine. One dose is recommended after age 54. Pneumococcal polysaccharide (PPSV23) vaccine. One dose is recommended after age 23. Talk to your health care provider about which screenings and vaccines you need and how often you need them. This information is not intended to replace advice given to you by your health care provider. Make sure you discuss any questions you have with your health care provider. Document Released: 01/23/2015 Document Revised: 09/16/2015 Document Reviewed: 10/28/2014 Elsevier Interactive Patient Education  2017 [place] Prevention in the Home Falls can cause injuries. They can happen to people of all ages. There are many things you can do to make your home safe and to help prevent falls. What can I do on the outside of my home? Regularly fix the edges of walkways and driveways and fix any cracks. Remove anything that might make you trip as you walk through a door, such as a raised step or threshold. Trim any bushes or trees on the path to your home. Use bright outdoor lighting. Clear any walking paths of anything that might make someone trip, such as rocks or tools. Regularly check to see if handrails are loose or broken. Make sure that both sides of any steps have handrails. Any raised decks and porches should have guardrails on the edges. Have any leaves, snow, or ice cleared regularly. Use sand or salt on walking paths during winter. Clean up any spills in your garage right away. This includes oil or grease spills. What can I do in the bathroom? Use night lights. Install grab bars by the toilet and in the tub and shower. Do not use towel bars as grab bars. Use non-skid mats or decals in  the tub or shower. If you need to sit down in the shower, use a plastic, non-slip stool. Keep the floor dry. Clean up any water that spills on the floor as soon as it happens. Remove soap buildup in the tub or shower regularly. Attach bath mats securely with double-sided non-slip rug tape. Do not have throw rugs and other things on the floor that can make you trip. What can I do in the bedroom? Use night lights. Make sure that you have a light by your bed that is easy to reach. Do not use any sheets or blankets that are too big for your bed. They should not hang down onto the floor. Have a firm chair that has side arms. You can use this for support while you get dressed. Do not have throw rugs and other things on the floor that can make you trip. What can I do in the kitchen? Clean up any spills right away. Avoid walking on wet floors. Keep items that you use a lot in easy-to-reach places. If you need to reach something above you, use a strong step stool that has a grab bar. Keep electrical cords out of the way. Do not use floor polish or wax that makes floors slippery. If you must use wax, use non-skid floor wax. Do not have throw rugs and other things on the floor that can make you trip. What can  I do with my stairs? Do not leave any items on the stairs. Make sure that there are handrails on both sides of the stairs and use them. Fix handrails that are broken or loose. Make sure that handrails are as long as the stairways. Check any carpeting to make sure that it is firmly attached to the stairs. Fix any carpet that is loose or worn. Avoid having throw rugs at the top or bottom of the stairs. If you do have throw rugs, attach them to the floor with carpet tape. Make sure that you have a light switch at the top of the stairs and the bottom of the stairs. If you do not have them, ask someone to add them for you. What else can I do to help prevent falls? Wear shoes that: Do not have high  heels. Have rubber bottoms. Are comfortable and fit you well. Are closed at the toe. Do not wear sandals. If you use a stepladder: Make sure that it is fully opened. Do not climb a closed stepladder. Make sure that both sides of the stepladder are locked into place. Ask someone to hold it for you, if possible. Clearly mark and make sure that you can see: Any grab bars or handrails. First and last steps. Where the edge of each step is. Use tools that help you move around (mobility aids) if they are needed. These include: Canes. Walkers. Scooters. Crutches. Turn on the lights when you go into a dark area. Replace any light bulbs as soon as they burn out. Set up your furniture so you have a clear path. Avoid moving your furniture around. If any of your floors are uneven, fix them. If there are any pets around you, be aware of where they are. Review your medicines with your doctor. Some medicines can make you feel dizzy. This can increase your chance of falling. Ask your doctor what other things that you can do to help prevent falls. This information is not intended to replace advice given to you by your health care provider. Make sure you discuss any questions you have with your health care provider. Document Released: 10/23/2008 Document Revised: 06/04/2015 Document Reviewed: 01/31/2014 Elsevier Interactive Patient Education  2017 [employer].

## 2021-09-24 NOTE — Progress Notes (Signed)
Subjective:   Johnathan [name] is a 71 y.o. male who presents for Medicare Annual/Subsequent preventive examination.  Virtual Visit via Telephone Note  I connected with  Johnathan [name] on 09/24/21 at 12:00 PM EDT by telephone and verified that I am speaking with the correct person using two identifiers.  Location: Patient: Home Provider: [facility] Persons participating in the virtual visit: patient/Nurse Health Advisor   I discussed the limitations, risks, security and privacy concerns of performing an evaluation and management service by telephone and the availability of in person appointments. The patient expressed understanding and agreed to proceed.  Interactive audio and video telecommunications were attempted between this nurse and patient, however failed, due to patient having technical difficulties OR patient did not have access to video capability.  We continued and completed visit with audio only.  Some vital signs may be absent or patient reported.   Buck Mcaffee E Douglass Dunshee, LPN   Review of Systems     Cardiac Risk Factors include: advanced age (>21mn, >>74women);male gender;obesity (BMI >30kg/m2);dyslipidemia;hypertension     Objective:    Today's Vitals   09/24/21 1206  Weight: 200 lb (90.7 kg)   Body mass index is 30.41 kg/m.     09/24/2021   12:11 PM 09/01/2020    4:57 PM 12/20/2018    2:57 PM 07/06/2018    9:53 AM 12/28/2015    8:11 AM 12/22/2015    3:37 PM  Advanced Directives  Does Patient Have a Medical Advance Directive? Yes Yes Yes No Yes Yes  Type of AParamedicof [place] will [place] will  Living will Living will  Does patient want to make changes to medical advance directive?     No - Patient declined   Copy of HPricein Chart? No - copy requested No - copy requested      Would patient like information on creating a medical advance  directive?    Yes (MAU/Ambulatory/Procedural Areas - Information given)      Current Medications (verified) Outpatient Encounter Medications as of 09/24/2021  Medication Sig   fluticasone (FLONASE) 50 MCG/ACT nasal spray SPRAY 2 SPRAYS INTO EACH NOSTRIL EVERY DAY   Glucosamine-Chondroit-Vit C-Mn (GLUCOSAMINE 1500 COMPLEX PO) Take by mouth.   hydrochlorothiazide (HYDRODIURIL) 25 MG tablet Take 1 tablet (25 mg total) by mouth daily.   lisinopril (ZESTRIL) 40 MG tablet Take 1 tablet (40 mg total) by mouth daily.   omeprazole (PRILOSEC) 40 MG capsule TAKE 1 CAPSULE BY MOUTH EVERY DAY   pravastatin (PRAVACHOL) 40 MG tablet Take 1 tablet (40 mg total) by mouth daily.   sildenafil (REVATIO) 20 MG tablet Take 1-5 tablets (20-100 mg total) by mouth as needed.   tamsulosin (FLOMAX) 0.4 MG CAPS capsule TAKE 1 CAPSULE BY MOUTH EVERY DAY   No facility-administered encounter medications on file as of 09/24/2021.    Allergies (verified) Patient has no known allergies.   History: Past Medical History:  Diagnosis Date   GERD (gastroesophageal reflux disease)    Hyperlipidemia    Hypertension    Past Surgical History:  Procedure Laterality Date   CATARACT EXTRACTION     EXCISION OF SKIN TAG N/A 12/28/2015   Procedure: EXCISION OF ANAL  SKIN TAGS AND 3 SKIN TAGS LEFT THIGH;  Surgeon: BGeorganna Skeans MD;  Location: [facility]  Service: General;  Laterality: N/A;   HEMORRHOID SURGERY  12/28/2015   Procedure: HEMORRHOIDECTOMY x2;  Surgeon: Georganna Skeans, MD;  Location: [facility];  Service: General;;   POLYPECTOMY     from vocal cords   Family History  Problem Relation Age of Onset   Cancer Mother        breast   Hepatitis C Brother    Social History   Socioeconomic History   Marital status: Married    Spouse name: Johnathan [name]   Number of children: 2   Years of education: Not on file   Highest education level: 12th grade  Occupational History   Occupation:  Retired  Tobacco Use   Smoking status: Never   Smokeless tobacco: Never  Vaping Use   Vaping Use: Never used  Substance and Sexual Activity   Alcohol use: No   Drug use: No   Sexual activity: Yes  Other Topics Concern   Not on file  Social History Narrative   Lives home with wife - gets VA benefits    Children live nearby   Social Determinants of Health   Financial Resource Strain: [place]  (09/24/2021)   Overall Financial Resource Strain (CARDIA)    Difficulty of Paying Living Expenses: Not hard at all  Food Insecurity: No Food Insecurity (09/24/2021)   Hunger Vital Sign    Worried About Running Out of Food in the Last Year: Never true    [place] in the Last Year: Never true  Transportation Needs: No Transportation Needs (09/24/2021)   PRAPARE - Hydrologist (Medical): No    Lack of Transportation (Non-Medical): No  Physical Activity: Sufficiently Active (09/24/2021)   Exercise Vital Sign    Days of Exercise per Week: 3 days    Minutes of Exercise per Session: 60 min  Stress: No Stress Concern Present (09/24/2021)   [place]    Feeling of Stress : Not at all  Social Connections: [place] (09/24/2021)   Social Connection and Isolation Panel [NHANES]    Frequency of Communication with Friends and Family: More than three times a week    Frequency of Social Gatherings with Friends and Family: More than three times a week    Attends Religious Services: More than 4 times per year    Active Member of [employer] or Organizations: Yes    Attends Music therapist: More than 4 times per year    Marital Status: Married    Tobacco Counseling Counseling given: Not Answered   Clinical Intake:  Pre-visit preparation completed: Yes  Pain : No/denies pain     BMI - recorded: 30.41 Nutritional Status: BMI > 30  Obese Nutritional Risks: None Diabetes: No  (borderline)  How often do you need to have someone help you when you read instructions, pamphlets, or other written materials from your doctor or pharmacy?: 1 - Never  Diabetic? borderline  Interpreter Needed?: No  Information entered by :: Roshana Shuffield, LPN   Activities of Daily Living    09/24/2021   12:12 PM  In your present state of health, do you have any difficulty performing the following activities:  Hearing? 1  Comment wears hearing aids  Vision? 0  Difficulty concentrating or making decisions? 0  Walking or climbing stairs? 0  Dressing or bathing? 0  Doing errands, shopping? 0  Preparing Food and eating ? N  Using the Toilet? N  In the past six months, have you accidently leaked urine? N  Do you have  problems with loss of bowel control? N  Managing your Medications? N  Managing your Finances? N  Housekeeping or managing your Housekeeping? N    Patient Care Team: Dettinger, Fransisca Kaufmann, MD as PCP - General (Family Medicine)  Indicate any recent Medical Services you may have received from other than [facility] providers in the past year (date may be approximate).     Assessment:   This is a routine wellness examination for Johnathan [name].  Hearing/Vision screen Hearing Screening - Comments:: Wears hearing aids - from [place] - Comments:: Denies vision difficulties - [facility]  Dietary issues and exercise activities discussed: Current Exercise Habits: Home exercise routine, Type of exercise: strength training/weights;walking, Time (Minutes): 60, Frequency (Times/Week): 3, Weekly Exercise (Minutes/Week): 180, Intensity: Moderate, Exercise limited by: None identified   Goals Addressed             This Visit's Progress    Exercise 3x per week (30 min per time)   On track    Try to exercise for at least 30 min, 3 times weekly       Depression Screen    09/24/2021   12:10 PM 03/29/2021    2:00 PM 09/07/2020    2:13 PM  09/01/2020    3:42 PM 06/19/2019    4:08 PM 11/22/2018    4:02 PM 07/06/2018    9:53 AM  PHQ 2/9 Scores  PHQ - 2 Score 0 0 0 0 0 0 0  PHQ- 9 Score  0 2        Fall Risk    09/24/2021   12:09 PM 03/29/2021    2:00 PM 09/01/2020    4:55 PM 06/19/2019    4:07 PM 11/22/2018    4:01 PM  Fall Risk   Falls in the past year? 0 0 0 1 1  Number falls in past yr: 0  0 1 0  Injury with Fall? 0  0 0 1  Risk for fall due to : No Fall Risks  Impaired vision History of fall(s)   Follow up Falls prevention discussed  Falls prevention discussed Falls evaluation completed     FALL RISK PREVENTION PERTAINING TO THE HOME:  Any stairs in or around the home? Yes  If so, are there any without handrails? No  Home free of loose throw rugs in walkways, pet beds, electrical cords, etc? Yes  Adequate lighting in your home to reduce risk of falls? Yes   ASSISTIVE DEVICES UTILIZED TO PREVENT FALLS:  Life alert? No  Use of a cane, walker or w/c? No  Grab bars in the bathroom? No  Shower chair or bench in shower? No  Elevated toilet seat or a handicapped toilet? No   TIMED UP AND GO:  Was the test performed? No . Telephonic visit  Cognitive Function:        09/24/2021   12:13 PM 07/06/2018    9:53 AM  6CIT Screen  What Year? 0 points 0 points  What month? 0 points 0 points  What time? 0 points 0 points  Count back from 20 0 points 0 points  Months in reverse 0 points 0 points  Repeat phrase 0 points 0 points  Total Score 0 points 0 points    Immunizations Immunization History  Administered Date(s) Administered   DTaP 01/11/2003   Fluad Quad(high Dose 65+) 11/10/2020   Influenza-Unspecified 10/16/2000   PFIZER Comirnaty(Gray Top)Covid-19 Tri-Sucrose Vaccine 04/01/2019, 05/02/2019, 07/14/2020  Tdap 12/20/2018    TDAP status: Up to date  Flu Vaccine status: Up to date  Pneumococcal vaccine status: Up to date  Covid-19 vaccine status: Information provided on how to obtain vaccines.    Qualifies for Shingles Vaccine? Yes   Zostavax completed Yes   Shingrix Completed?: Yes  Screening Tests Health Maintenance  Topic Date Due   Zoster Vaccines- Shingrix (1 of 2) Never done   Pneumonia Vaccine 42+ Years old (1 - PCV) Never done   COLONOSCOPY (Pts 45-58yr Insurance coverage will need to be confirmed)  08/15/2016   COVID-19 Vaccine (4 - Pfizer risk series) 09/08/2020   INFLUENZA VACCINE  08/10/2021   TETANUS/TDAP  12/19/2028   Hepatitis C Screening  Completed   HPV VACCINES  Aged Out    Health Maintenance  Health Maintenance Due  Topic Date Due   Zoster Vaccines- Shingrix (1 of 2) Never done   Pneumonia Vaccine 72 Years old (1 - PCV) Never done   COLONOSCOPY (Pts 45-472yrInsurance coverage will need to be confirmed)  08/15/2016   COVID-19 Vaccine (4 - Pfizer risk series) 09/08/2020   INFLUENZA VACCINE  08/10/2021    Colorectal cancer screening: Type of screening: Colonoscopy. Completed 2015. Repeat every 3 years He is behind - [facility]  Lung Cancer Screening: (Low Dose CT Chest recommended if Age 72-80ears, 30 pack-year currently smoking OR have quit w/in 15years.) does not qualify.   Additional Screening:  Hepatitis C Screening: does qualify; Completed 06/27/2016  Vision Screening: Recommended annual ophthalmology exams for early detection of glaucoma and other disorders of the eye. Is the patient up to date with their annual eye exam?  Yes  Who is the provider or what is the name of the office in which the patient attends annual eye exams? [facility] If pt is not established with a provider, would they like to be referred to a provider to establish care? No .   Dental Screening: Recommended annual dental exams for proper oral hygiene  Community Resource Referral / Chronic Care Management: CRR required this visit?  No   CCM required this visit?  No      Plan:     I have personally reviewed and noted the following in the  patient's chart:   Medical and social history Use of alcohol, tobacco or illicit drugs  Current medications and supplements including opioid prescriptions. Patient is not currently taking opioid prescriptions. Functional ability and status Nutritional status Physical activity Advanced directives List of other physicians Hospitalizations, surgeries, and ER visits in previous 12 months Vitals Screenings to include cognitive, depression, and falls Referrals and appointments  In addition, I have reviewed and discussed with patient certain preventive protocols, quality metrics, and best practice recommendations. A written personalized care plan for preventive services as well as general preventive health recommendations were provided to patient.     AmSandrea HammondLPN   09/18/74/8115 Nurse Notes: [facility] per patient, he is up to date.

## 2021-09-27 ENCOUNTER — Other Ambulatory Visit: Payer: Medicare HMO

## 2021-09-27 DIAGNOSIS — N401 Enlarged prostate with lower urinary tract symptoms: Secondary | ICD-10-CM

## 2021-09-27 DIAGNOSIS — R7303 Prediabetes: Secondary | ICD-10-CM

## 2021-09-27 DIAGNOSIS — I10 Essential (primary) hypertension: Secondary | ICD-10-CM

## 2021-09-27 DIAGNOSIS — E785 Hyperlipidemia, unspecified: Secondary | ICD-10-CM | POA: Diagnosis not present

## 2021-09-27 DIAGNOSIS — R3912 Poor urinary stream: Secondary | ICD-10-CM | POA: Diagnosis not present

## 2021-09-27 LAB — BAYER DCA HB A1C WAIVED: HB A1C (BAYER DCA - WAIVED): 6.2 % — ABNORMAL HIGH (ref 4.8–5.6)

## 2021-09-29 ENCOUNTER — Encounter: Payer: Self-pay | Admitting: Family Medicine

## 2021-09-29 ENCOUNTER — Ambulatory Visit (INDEPENDENT_AMBULATORY_CARE_PROVIDER_SITE_OTHER): Payer: Medicare HMO | Admitting: Family Medicine

## 2021-09-29 VITALS — BP 119/75 | HR 75 | Temp 98.0°F | Ht 68.0 in | Wt 195.0 lb

## 2021-09-29 DIAGNOSIS — Z23 Encounter for immunization: Secondary | ICD-10-CM

## 2021-09-29 DIAGNOSIS — R7303 Prediabetes: Secondary | ICD-10-CM | POA: Diagnosis not present

## 2021-09-29 DIAGNOSIS — K219 Gastro-esophageal reflux disease without esophagitis: Secondary | ICD-10-CM | POA: Diagnosis not present

## 2021-09-29 DIAGNOSIS — E785 Hyperlipidemia, unspecified: Secondary | ICD-10-CM

## 2021-09-29 DIAGNOSIS — I10 Essential (primary) hypertension: Secondary | ICD-10-CM

## 2021-09-29 NOTE — Progress Notes (Signed)
BP 119/75   Pulse 75   Temp 98 F (36.7 C)   Ht $R'5\' 8"'WQ$  (1.727 m)   Wt 195 lb (88.5 kg)   SpO2 97%   BMI 29.65 kg/m    Subjective:   Patient ID: Johnathan [name], male    DOB: 1950-04-26, 71 y.o.   MRN: [id number]  HPI: Johnathan [name] is a 71 y.o. male presenting on 09/29/2021 for Medical Management of Chronic Issues and Hypertension   HPI Prediabetes Patient comes in today for recheck of his diabetes. Patient has been currently taking no medication currently. Patient is currently on an ACE inhibitor/ARB. Patient has not seen an ophthalmologist this year. Patient denies any issues with their feet. The symptom started onset as an adult hypertension and hyperlipidemia ARE RELATED TO DM   Hypertension Patient is currently on lisinopril hydrochlorothiazide, and their blood pressure today is 119/75. Patient denies any lightheadedness or dizziness. Patient denies headaches, blurred vision, chest pains, shortness of breath, or weakness. Denies any side effects from medication and is content with current medication.   Hyperlipidemia Patient is coming in for recheck of his hyperlipidemia. The patient is currently taking pravastatin. They deny any issues with myalgias or history of liver damage from it. They deny any focal numbness or weakness or chest pain.   GERD Patient is currently on omeprazole.  She denies any major symptoms or abdominal pain or belching or burping. She denies any blood in her stool or lightheadedness or dizziness.   Relevant past medical, surgical, family and social history reviewed and updated as indicated. Interim medical history since our last visit reviewed. Allergies and medications reviewed and updated.  Review of Systems  Constitutional:  Negative for chills and fever.  Eyes:  Negative for visual disturbance.  Respiratory:  Negative for shortness of breath and wheezing.   Cardiovascular:  Negative for chest pain and leg swelling.  Musculoskeletal:   Negative for back pain and gait problem.  Skin:  Negative for rash.  Neurological:  Negative for dizziness, weakness and light-headedness.  All other systems reviewed and are negative.   Per HPI unless specifically indicated above   Allergies as of 09/29/2021   No Known Allergies      Medication List        Accurate as of September 29, 2021 11:07 AM. If you have any questions, ask your nurse or doctor.          fluticasone 50 MCG/ACT nasal spray Commonly known as: FLONASE SPRAY 2 SPRAYS INTO EACH NOSTRIL EVERY DAY   GLUCOSAMINE 1500 COMPLEX PO Take by mouth.   hydrochlorothiazide 25 MG tablet Commonly known as: HYDRODIURIL Take 1 tablet (25 mg total) by mouth daily.   lisinopril 40 MG tablet Commonly known as: ZESTRIL Take 1 tablet (40 mg total) by mouth daily.   omeprazole 40 MG capsule Commonly known as: PRILOSEC TAKE 1 CAPSULE BY MOUTH EVERY DAY   pravastatin 40 MG tablet Commonly known as: PRAVACHOL Take 1 tablet (40 mg total) by mouth daily.   sildenafil 20 MG tablet Commonly known as: REVATIO Take 1-5 tablets (20-100 mg total) by mouth as needed.   tamsulosin 0.4 MG Caps capsule Commonly known as: FLOMAX TAKE 1 CAPSULE BY MOUTH EVERY DAY         Objective:   BP 119/75   Pulse 75   Temp 98 F (36.7 C)   Ht $R'5\' 8"'gC$  (1.727 m)   Wt 195 lb (88.5 kg)   SpO2 97%  BMI 29.65 kg/m   Wt Readings from Last 3 Encounters:  09/29/21 195 lb (88.5 kg)  09/24/21 200 lb (90.7 kg)  03/29/21 204 lb (92.5 kg)    Physical Exam Vitals and nursing note reviewed.  Constitutional:      General: He is not in acute distress.    Appearance: He is well-developed. He is not diaphoretic.  Eyes:     General: No scleral icterus.    Conjunctiva/sclera: Conjunctivae normal.  Neck:     Thyroid: No thyromegaly.  Cardiovascular:     Rate and Rhythm: Normal rate and regular rhythm.     Heart sounds: Normal heart sounds. No murmur heard. Pulmonary:     Effort:  Pulmonary effort is normal. No respiratory distress.     Breath sounds: Normal breath sounds. No wheezing.  Musculoskeletal:        General: No swelling. Normal range of motion.     Cervical back: Neck supple.  Lymphadenopathy:     Cervical: No cervical adenopathy.  Skin:    General: Skin is warm and dry.     Findings: No rash.  Neurological:     Mental Status: He is alert and oriented to person, place, and time.     Coordination: Coordination normal.  Psychiatric:        Behavior: Behavior normal.     Results for orders placed or performed in visit on 09/27/21  CBC with Differential/Platelet  Result Value Ref Range   WBC 6.0 3.4 - 10.8 x10E3/uL   RBC 4.86 4.14 - 5.80 x10E6/uL   Hemoglobin 15.6 13.0 - 17.7 g/dL   Hematocrit 44.3 37.5 - 51.0 %   MCV 91 79 - 97 fL   MCH 32.1 26.6 - 33.0 pg   MCHC 35.2 31.5 - 35.7 g/dL   RDW 12.1 11.6 - 15.4 %   Platelets 167 150 - 450 x10E3/uL   Neutrophils 56 Not Estab. %   Lymphs 35 Not Estab. %   Monocytes 7 Not Estab. %   Eos 1 Not Estab. %   Basos 1 Not Estab. %   Neutrophils Absolute 3.4 1.4 - 7.0 x10E3/uL   Lymphocytes Absolute 2.1 0.7 - 3.1 x10E3/uL   Monocytes Absolute 0.4 0.1 - 0.9 x10E3/uL   EOS (ABSOLUTE) 0.1 0.0 - 0.4 x10E3/uL   Basophils Absolute 0.0 0.0 - 0.2 x10E3/uL   Immature Granulocytes 0 Not Estab. %   Immature Grans (Abs) 0.0 0.0 - 0.1 x10E3/uL  CMP14+EGFR  Result Value Ref Range   Glucose 139 (H) 70 - 99 mg/dL   BUN 17 8 - 27 mg/dL   Creatinine, Ser 0.95 0.76 - 1.27 mg/dL   eGFR 85 >59 mL/min/1.73   BUN/Creatinine Ratio 18 10 - 24   Sodium 139 134 - 144 mmol/L   Potassium 3.8 3.5 - 5.2 mmol/L   Chloride 101 96 - 106 mmol/L   CO2 21 20 - 29 mmol/L   Calcium 9.2 8.6 - 10.2 mg/dL   Total Protein 7.3 6.0 - 8.5 g/dL   Albumin 4.7 3.8 - 4.8 g/dL   Globulin, Total 2.6 1.5 - 4.5 g/dL   Albumin/Globulin Ratio 1.8 1.2 - 2.2   Bilirubin Total 0.5 0.0 - 1.2 mg/dL   Alkaline Phosphatase 79 44 - 121 IU/L   AST 18 0 -  40 IU/L   ALT 12 0 - 44 IU/L  Lipid panel  Result Value Ref Range   Cholesterol, Total 136 100 - 199 mg/dL   Triglycerides 129 0 -  149 mg/dL   HDL 35 (L) >39 mg/dL   VLDL Cholesterol Cal 23 5 - 40 mg/dL   LDL Chol Calc (NIH) 78 0 - 99 mg/dL   Chol/HDL Ratio 3.9 0.0 - 5.0 ratio  Testosterone,Free and Total  Result Value Ref Range   Testosterone 313 264 - 916 ng/dL   Testosterone, Free WILL FOLLOW   Bayer DCA Hb A1c Waived  Result Value Ref Range   HB A1C (BAYER DCA - WAIVED) 6.2 (H) 4.8 - 5.6 %  PSA, total and free  Result Value Ref Range   Prostate Specific Ag, Serum 3.3 0.0 - 4.0 ng/mL   PSA, Free 0.81 N/A ng/mL   PSA, Free Pct 24.5 %    Assessment & Plan:   Problem List Items Addressed This Visit       Cardiovascular and Mediastinum   Essential hypertension     Digestive   GERD (gastroesophageal reflux disease)     Other   Hyperlipidemia LDL goal <130 - Primary   Prediabetes    Continue current medicine, A1c looks good at 6.2, blood work otherwise looked good.  No changes. Follow up plan: Return in about 6 months (around 03/30/2022), or if symptoms worsen or fail to improve, for Prediabetes hypertension and cholesterol.  Counseling provided for all of the vaccine components No orders of the defined types were placed in this encounter.   Caryl Pina, MD [facility] 09/29/2021, 11:07 AM

## 2021-10-03 LAB — CMP14+EGFR
ALT: 12 IU/L (ref 0–44)
AST: 18 IU/L (ref 0–40)
Albumin/Globulin Ratio: 1.8 (ref 1.2–2.2)
Albumin: 4.7 g/dL (ref 3.8–4.8)
Alkaline Phosphatase: 79 IU/L (ref 44–121)
BUN/Creatinine Ratio: 18 (ref 10–24)
BUN: 17 mg/dL (ref 8–27)
Bilirubin Total: 0.5 mg/dL (ref 0.0–1.2)
CO2: 21 mmol/L (ref 20–29)
Calcium: 9.2 mg/dL (ref 8.6–10.2)
Chloride: 101 mmol/L (ref 96–106)
Creatinine, Ser: 0.95 mg/dL (ref 0.76–1.27)
Globulin, Total: 2.6 g/dL (ref 1.5–4.5)
Glucose: 139 mg/dL — ABNORMAL HIGH (ref 70–99)
Potassium: 3.8 mmol/L (ref 3.5–5.2)
Sodium: 139 mmol/L (ref 134–144)
Total Protein: 7.3 g/dL (ref 6.0–8.5)
eGFR: 85 mL/min/{1.73_m2} (ref >59)

## 2021-10-03 LAB — CBC WITH DIFFERENTIAL/PLATELET
Basophils Absolute: 0 10*3/uL (ref 0.0–0.2)
Basos: 1 %
EOS (ABSOLUTE): 0.1 10*3/uL (ref 0.0–0.4)
Eos: 1 %
Hematocrit: 44.3 % (ref 37.5–51.0)
Hemoglobin: 15.6 g/dL (ref 13.0–17.7)
Immature Grans (Abs): 0 10*3/uL (ref 0.0–0.1)
Immature Granulocytes: 0 %
Lymphocytes Absolute: 2.1 10*3/uL (ref 0.7–3.1)
Lymphs: 35 %
MCH: 32.1 pg (ref 26.6–33.0)
MCHC: 35.2 g/dL (ref 31.5–35.7)
MCV: 91 fL (ref 79–97)
Monocytes Absolute: 0.4 10*3/uL (ref 0.1–0.9)
Monocytes: 7 %
Neutrophils Absolute: 3.4 10*3/uL (ref 1.4–7.0)
Neutrophils: 56 %
Platelets: 167 10*3/uL (ref 150–450)
RBC: 4.86 x10E6/uL (ref 4.14–5.80)
RDW: 12.1 % (ref 11.6–15.4)
WBC: 6 10*3/uL (ref 3.4–10.8)

## 2021-10-03 LAB — PSA, TOTAL AND FREE
PSA, Free Pct: 24.5 %
PSA, Free: 0.81 ng/mL
Prostate Specific Ag, Serum: 3.3 ng/mL (ref 0.0–4.0)

## 2021-10-03 LAB — TESTOSTERONE,FREE AND TOTAL
Testosterone, Free: 3.9 pg/mL — ABNORMAL LOW (ref 6.6–18.1)
Testosterone: 313 ng/dL (ref 264–916)

## 2021-10-03 LAB — LIPID PANEL
Chol/HDL Ratio: 3.9 ratio (ref 0.0–5.0)
Cholesterol, Total: 136 mg/dL (ref 100–199)
HDL: 35 mg/dL — ABNORMAL LOW (ref >39)
LDL Chol Calc (NIH): 78 mg/dL (ref 0–99)
Triglycerides: 129 mg/dL (ref 0–149)
VLDL Cholesterol Cal: 23 mg/dL (ref 5–40)

## 2021-10-13 ENCOUNTER — Other Ambulatory Visit: Payer: Self-pay | Admitting: Family Medicine

## 2021-10-13 DIAGNOSIS — R059 Cough, unspecified: Secondary | ICD-10-CM

## 2021-10-13 DIAGNOSIS — J069 Acute upper respiratory infection, unspecified: Secondary | ICD-10-CM

## 2021-11-08 ENCOUNTER — Encounter: Payer: Self-pay | Admitting: Family Medicine

## 2021-11-08 ENCOUNTER — Ambulatory Visit (INDEPENDENT_AMBULATORY_CARE_PROVIDER_SITE_OTHER): Payer: Medicare HMO | Admitting: Family Medicine

## 2021-11-08 VITALS — Temp 98.2°F | Ht 68.0 in | Wt 195.4 lb

## 2021-11-08 DIAGNOSIS — R42 Dizziness and giddiness: Secondary | ICD-10-CM | POA: Diagnosis not present

## 2021-11-08 MED ORDER — MECLIZINE HCL 25 MG PO TABS: 25.0000 mg | ORAL_TABLET | Freq: Three times a day (TID) | ORAL | 0 refills | Status: AC | PRN

## 2021-11-08 NOTE — Patient Instructions (Signed)
Vertigo Vertigo is the feeling that you or the things around you are moving when they are not. This feeling can come and go at any time. Vertigo often goes away on its own. This condition can be dangerous if it happens when you are doing activities like driving or working with machines. Your doctor will do tests to find the cause of your vertigo. These tests will also help your doctor decide on the best treatment for you. Follow these instructions at home: Eating and drinking     Drink enough fluid to keep your pee (urine) pale yellow. Do not drink alcohol. Activity Return to your normal activities when your doctor says that it is safe. In the morning, first sit up on the side of the bed. When you feel okay, stand slowly while you hold onto something until you know that your balance is fine. Move slowly. Avoid sudden body or head movements or certain positions, as told by your doctor. Use a cane if you have trouble standing or walking. Sit down right away if you feel dizzy. Avoid doing any tasks or activities that can cause danger to you or others if you get dizzy. Avoid bending down if you feel dizzy. Place items in your home so that they are easy for you to reach without bending or leaning over. Do not drive or use machinery if you feel dizzy. General instructions Take over-the-counter and prescription medicines only as told by your doctor. Keep all follow-up visits. Contact a doctor if: Your medicine does not help your vertigo. Your problems get worse or you have new symptoms. You have a fever. You feel like you may vomit (nauseous), or this feeling gets worse. You start to vomit. Your family or friends see changes in how you act. You lose feeling (have numbness) in part of your body. You feel prickling and tingling in a part of your body. Get help right away if: You are always dizzy. You faint. You get very bad headaches. You get a stiff neck. Bright light starts to bother  you. You have trouble moving or talking. You feel weak in your hands, arms, or legs. You have changes in your hearing or in how you see (vision). These symptoms may be an emergency. Get help right away. Call your local emergency services (911 in the [place] Do not wait to see if the symptoms will go away. Do not drive yourself to the hospital. Summary Vertigo is the feeling that you or the things around you are moving when they are not. Your doctor will do tests to find the cause of your vertigo. You may be told to avoid some tasks, positions, or movements. Contact a doctor if your medicine is not helping, or if you have a fever, new symptoms, or a change in how you act. Get help right away if you get very bad headaches, or if you have changes in how you speak, hear, or see. This information is not intended to replace advice given to you by your health care provider. Make sure you discuss any questions you have with your health care provider. Document Revised: 11/27/2019 Document Reviewed: 11/27/2019 Elsevier Patient Education  2023 [employer]  

## 2021-11-08 NOTE — Progress Notes (Signed)
I have separately seen and examined the patient. I have discussed the findings and exam with student Dr Jerline Pain and agree with the below note.  My changes/additions are outlined in BLUE.    S: Patient reports couple week history of dizziness.  He describes the dizziness as room spinning.  No lightheadedness.  No loss of consciousness.  He does not hydrate well and his wife comments that he in fact is terrible at hydrating.  She encourages him frequently but he seems to be resistant.  He is on a diuretic as well as Flomax.  He does feel that the vertigo is present with head turning but is not quite sure if it is to the left or right.  No unilateral symptoms.  No reports of falls.  No recent illness.  He wears hearing aids and sometimes does have some ear pain.  Has history of ear trauma in the past.  O: Vitals:   11/08/21 1030  Temp: 98.2 F (36.8 C)  SpO2: 94%    Temp 98.2 F (36.8 C)   Ht '5\' 8"'$  (1.727 m)   Wt 195 lb 6.4 oz (88.6 kg)   SpO2 94%   BMI 29.71 kg/m  General appearance: alert, cooperative, appears stated age, and no distress Head: Normocephalic, without obvious abnormality, atraumatic Eyes: negative findings: lids and lashes normal, conjunctivae and sclerae normal, corneas clear, and pupils equal, round, reactive to light and accomodation; EOMI Ears: normal TM's and external ear canals both ears Neck: no adenopathy, supple, symmetrical, trachea midline, and thyroid not enlarged, symmetric, no tenderness/mass/nodules Lungs: clear to auscultation bilaterally Heart: regular rate and rhythm, S1, S2 normal, no murmur, click, rub or gallop Neurologic: Grossly normal. CN 2-12 grossly in tact EXCEPT hearing.   Orthostatic VS for the past 72 hrs (Last 3 readings):  Orthostatic BP Orthostatic Pulse  11/08/21 1037 118/74 71  11/08/21 1036 115/64 70  11/08/21 1030 118/78 61   A/P:  Vertigo - Plan: meclizine (ANTIVERT) 25 MG tablet  Clinically consistent with vertigo.  No  nystagmus appreciated.  Orthostatics were negative.  No focal neurologic deficits besides hearing loss which are compensated by hearing aids.  His ear exam was unremarkable except for some scarring.  Certainly no cerumen impaction or evidence of secondary infection causing symptoms.  I did encourage p.o. hydration.  Gave handout on Epley maneuver.  Highly encouraged him to contact our office if symptoms are not improving or if they abruptly worsen.  May need to consider referral to ENT versus vestibular rehab if symptoms are refractory to the aforementioned treatments  Alexxia Stankiewicz M. Lajuana Ripple, [facility]   -------------------------------------------------------------------------------------------------------------------------------------------------------------------------------------       Subjective: [id number] PCP: Dettinger, Fransisca Kaufmann, MD UXN:ATFTDDU D Mirsky is a 71 y.o. male with a history of hypertension, GERD, and hyperlipidemia presenting to clinic today for new-onset dizziness.   Dizziness Mr. Schum reports persistent dizziness affecting his quality of life, particularly upon standing, accompanied by a spinning room and "swimming" vision. He occasionally experiences earache, primarily on the left side, with no significant impact or relationship to the dizziness symptoms. This dizziness, which began suddenly two weeks ago, has improved since its initial onset but has not gone away. Has not had any viral illness with the exception of COVID in July. The patient has not experienced falls but notes increased unsteadiness accompanying the dizziness symptoms. There is a history of past ear trauma involving a stuck Q-tip, resolved by the [place]. No trauma to the ear since  then, has had hearing loss for decades, associated with his prior service in [place]. No recent illness or stresses are noted, and he has not been experiencing heart palpitations. No additional  interventions or medications have been attempted to alleviate his symptoms.  ROS: Per HPI  No Known Allergies Past Medical History:  Diagnosis Date   GERD (gastroesophageal reflux disease)    Hyperlipidemia    Hypertension     Current Outpatient Medications:    fluticasone (FLONASE) 50 MCG/ACT nasal spray, SPRAY 2 SPRAYS INTO EACH NOSTRIL EVERY DAY, Disp: 48 mL, Rfl: 1   Glucosamine-Chondroit-Vit C-Mn (GLUCOSAMINE 1500 COMPLEX PO), Take by mouth., Disp: , Rfl:    hydrochlorothiazide (HYDRODIURIL) 25 MG tablet, Take 1 tablet (25 mg total) by mouth daily., Disp: 90 tablet, Rfl: 3   lisinopril (ZESTRIL) 40 MG tablet, Take 1 tablet (40 mg total) by mouth daily., Disp: 90 tablet, Rfl: 3   meclizine (ANTIVERT) 25 MG tablet, Take 1 tablet (25 mg total) by mouth 3 (three) times daily as needed for dizziness., Disp: 30 tablet, Rfl: 0   omeprazole (PRILOSEC) 40 MG capsule, TAKE 1 CAPSULE BY MOUTH EVERY DAY, Disp: 90 capsule, Rfl: 3   pravastatin (PRAVACHOL) 40 MG tablet, Take 1 tablet (40 mg total) by mouth daily., Disp: 90 tablet, Rfl: 3   sildenafil (REVATIO) 20 MG tablet, Take 1-5 tablets (20-100 mg total) by mouth as needed., Disp: 30 tablet, Rfl: 1   tamsulosin (FLOMAX) 0.4 MG CAPS capsule, TAKE 1 CAPSULE BY MOUTH EVERY DAY, Disp: 90 capsule, Rfl: 1 Social History   Socioeconomic History   Marital status: Married    Spouse name: Caren Griffins   Number of children: 2   Years of education: Not on file   Highest education level: 12th grade  Occupational History   Occupation: Retired  Tobacco Use   Smoking status: Never   Smokeless tobacco: Never  Vaping Use   Vaping Use: Never used  Substance and Sexual Activity   Alcohol use: No   Drug use: No   Sexual activity: Yes  Other Topics Concern   Not on file  Social History Narrative   Lives home with wife - gets VA benefits    Children live nearby   Social Determinants of Health   Financial Resource Strain: Low Risk  (09/24/2021)    Overall Financial Resource Strain (CARDIA)    Difficulty of Paying Living Expenses: Not hard at all  Food Insecurity: No Food Insecurity (09/24/2021)   Hunger Vital Sign    Worried About Running Out of Food in the Last Year: Never true    [place] in the Last Year: Never true  Transportation Needs: No Transportation Needs (09/24/2021)   PRAPARE - Hydrologist (Medical): No    Lack of Transportation (Non-Medical): No  Physical Activity: Sufficiently Active (09/24/2021)   Exercise Vital Sign    Days of Exercise per Week: 3 days    Minutes of Exercise per Session: 60 min  Stress: No Stress Concern Present (09/24/2021)   [place]    Feeling of Stress : Not at all  Social Connections: [place] (09/24/2021)   Social Connection and Isolation Panel [NHANES]    Frequency of Communication with Friends and Family: More than three times a week    Frequency of Social Gatherings with Friends and Family: More than three times a week    Attends Religious Services: More than 4  times per year    Active Member of Clubs or Organizations: Yes    Attends Archivist Meetings: More than 4 times per year    Marital Status: Married  Human resources officer Violence: Not At Risk (09/24/2021)   Humiliation, Afraid, Rape, and Kick questionnaire    Fear of Current or Ex-Partner: No    Emotionally Abused: No    Physically Abused: No    Sexually Abused: No   Family History  Problem Relation Age of Onset   Cancer Mother        breast   Hepatitis C Brother     Objective: Office vital signs reviewed. Temp 98.2 F (36.8 C)   Ht '5\' 8"'$  (1.727 m)   Wt 195 lb 6.4 oz (88.6 kg)   SpO2 94%   BMI 29.71 kg/m   Physical Examination:  General: Awake, alert, nourished, No acute distress HEENT: Normal    Neck: No masses palpated. No lymphadenopathy    Ears: Tympanic membranes intact, normal light  reflex, no erythema, no bulging    Eyes: PERRLA, extraocular membranes intact, sclera white    Nose: nasal turbinates moist, no nasal discharge    Throat: moist mucus membranes, no erythema, no tonsillar exudate.  Airway is patent Cardio: regular rate and rhythm, soft S1/S2 heard, no murmurs appreciated Pulm: clear to auscultation bilaterally, no wheezes, rhonchi or rales; normal work of breathing on room air GI: soft, non-tender, non-distended, bowel sounds present x4, no hepatomegaly, no splenomegaly, no masses Extremities: warm, well perfused, No edema, cyanosis or clubbing; +1 pulses bilaterally MSK: normal gait, appears slightly hesitant with walking  Skin: dry; intact; no rashes or lesions Neuro:  Strength and light touch sensation grossly intact, intact DTRs. mental status, cranial nerves, motor function, sensation, coordination, balance, and reflexes are all normal.   Assessment/ Plan: 71 y.o. male   Based on the patient's report of room-spinning sensation without feelings of impending fainting or lightheadedness, benign paroxysmal positional vertigo (BPPV) is most likely. Although the patient experiences symptoms upon standing, the presence of dizziness symptoms and the unremarkable orthostatic blood pressure measurements suggest a lower likelihood of orthostatic hypotension. Orthostatic vital signs lying down showed blood pressures of 118/78, pulse of 61; sitting 115/64, pulse of 70; standing blood pressure of 118/74 with a pulse of 71. The neurological examination yielded no focal or other concerning findings, making stroke low on the differential.   The patient received instructions for the Epley maneuver to perform at home, with a plan to refer to a vestibular specialist if no improvement in symptoms for further evaluation and physical therapy. Additionally, a prescription for meclizine was given to alleviate dizziness symptoms. The patient notes inadequate fluid intake throughout the  day. Patient was advised to increase their fluid intake as I expect that improving hydration may contribute to symptom relief.   Meds ordered this encounter  Medications   meclizine (ANTIVERT) 25 MG tablet    Sig: Take 1 tablet (25 mg total) by mouth 3 (three) times daily as needed for dizziness.    Dispense:  30 tablet    Refill:  0    Stephani Police, MS3

## 2022-03-16 ENCOUNTER — Other Ambulatory Visit: Payer: Self-pay | Admitting: Family Medicine

## 2022-03-16 DIAGNOSIS — N401 Enlarged prostate with lower urinary tract symptoms: Secondary | ICD-10-CM

## 2022-03-16 DIAGNOSIS — I10 Essential (primary) hypertension: Secondary | ICD-10-CM

## 2022-03-16 DIAGNOSIS — E785 Hyperlipidemia, unspecified: Secondary | ICD-10-CM

## 2022-03-30 ENCOUNTER — Ambulatory Visit (INDEPENDENT_AMBULATORY_CARE_PROVIDER_SITE_OTHER): Payer: Medicare HMO | Admitting: Family Medicine

## 2022-03-30 ENCOUNTER — Encounter: Payer: Self-pay | Admitting: Family Medicine

## 2022-03-30 VITALS — BP 130/77 | HR 85 | Ht 68.0 in | Wt 200.0 lb

## 2022-03-30 DIAGNOSIS — I10 Essential (primary) hypertension: Secondary | ICD-10-CM | POA: Diagnosis not present

## 2022-03-30 DIAGNOSIS — E349 Endocrine disorder, unspecified: Secondary | ICD-10-CM

## 2022-03-30 DIAGNOSIS — E785 Hyperlipidemia, unspecified: Secondary | ICD-10-CM | POA: Diagnosis not present

## 2022-03-30 DIAGNOSIS — N401 Enlarged prostate with lower urinary tract symptoms: Secondary | ICD-10-CM | POA: Diagnosis not present

## 2022-03-30 DIAGNOSIS — K219 Gastro-esophageal reflux disease without esophagitis: Secondary | ICD-10-CM | POA: Diagnosis not present

## 2022-03-30 DIAGNOSIS — R3912 Poor urinary stream: Secondary | ICD-10-CM

## 2022-03-30 DIAGNOSIS — R7303 Prediabetes: Secondary | ICD-10-CM | POA: Diagnosis not present

## 2022-03-30 MED ORDER — OMEPRAZOLE 40 MG PO CPDR: 40.0000 mg | DELAYED_RELEASE_CAPSULE | Freq: Every day | ORAL | 3 refills | Status: DC

## 2022-03-30 MED ORDER — LISINOPRIL 40 MG PO TABS: 40.0000 mg | ORAL_TABLET | Freq: Every day | ORAL | 3 refills | Status: DC

## 2022-03-30 MED ORDER — PRAVASTATIN SODIUM 40 MG PO TABS: 40.0000 mg | ORAL_TABLET | Freq: Every day | ORAL | 3 refills | Status: DC

## 2022-03-30 MED ORDER — TAMSULOSIN HCL 0.4 MG PO CAPS: 0.4000 mg | ORAL_CAPSULE | Freq: Every day | ORAL | 3 refills | Status: DC

## 2022-03-30 MED ORDER — HYDROCHLOROTHIAZIDE 25 MG PO TABS: 25.0000 mg | ORAL_TABLET | Freq: Every day | ORAL | 3 refills | Status: DC

## 2022-03-30 NOTE — Progress Notes (Signed)
BP 130/77   Pulse 85   Ht 5\' 8"  (1.727 m)   Wt 200 lb (90.7 kg)   SpO2 96%   BMI 30.41 kg/m    Subjective:   Patient ID: Johnathan [name], male    DOB: 1950-08-03, 72 y.o.   MRN: [id number]  HPI: Johnathan [name] is a 72 y.o. male presenting on 03/30/2022 for Medical Management of Chronic Issues, Hyperlipidemia, Hypertension, and Prediabetes   HPI Hypertension Patient is currently on lisinopril and hydrochlorothiazide, and their blood pressure today is 130/77. Patient denies any lightheadedness or dizziness. Patient denies headaches, blurred vision, chest pains, shortness of breath, or weakness. Denies any side effects from medication and is content with current medication.   Hyperlipidemia Patient is coming in for recheck of his hyperlipidemia. The patient is currently taking pravastatin. They deny any issues with myalgias or history of liver damage from it. They deny any focal numbness or weakness or chest pain.   Prediabetes Patient comes in today for recheck of his diabetes. Patient has been currently taking no medicine currently, diet control. Patient is currently on an ACE inhibitor/ARB. Patient has not seen an ophthalmologist this year. Patient denies any issues with their feet. The symptom started onset as an adult hypertension and hyperlipidemia ARE RELATED TO DM   GERD Patient is currently on omeprazole.  She denies any major symptoms or abdominal pain or belching or burping. She denies any blood in her stool or lightheadedness or dizziness.   Patient has a history of testosterone deficiency that looks mildly deficient, wants to be retested today.  Relevant past medical, surgical, family and social history reviewed and updated as indicated. Interim medical history since our last visit reviewed. Allergies and medications reviewed and updated.  Review of Systems  Constitutional:  Negative for chills and fever.  Eyes:  Negative for visual disturbance.  Respiratory:   Negative for shortness of breath and wheezing.   Cardiovascular:  Negative for chest pain and leg swelling.  Musculoskeletal:  Negative for back pain and gait problem.  Skin:  Negative for rash.  Neurological:  Negative for dizziness and light-headedness.  All other systems reviewed and are negative.   Per HPI unless specifically indicated above   Allergies as of 03/30/2022   No Known Allergies      Medication List        Accurate as of March 30, 2022 10:28 AM. If you have any questions, ask your nurse or doctor.          fluticasone 50 MCG/ACT nasal spray Commonly known as: FLONASE SPRAY 2 SPRAYS INTO EACH NOSTRIL EVERY DAY   GLUCOSAMINE 1500 COMPLEX PO Take by mouth.   hydrochlorothiazide 25 MG tablet Commonly known as: HYDRODIURIL Take 1 tablet (25 mg total) by mouth daily.   lisinopril 40 MG tablet Commonly known as: ZESTRIL Take 1 tablet (40 mg total) by mouth daily.   meclizine 25 MG tablet Commonly known as: ANTIVERT Take 1 tablet (25 mg total) by mouth 3 (three) times daily as needed for dizziness.   omeprazole 40 MG capsule Commonly known as: PRILOSEC Take 1 capsule (40 mg total) by mouth daily. What changed:  how much to take how to take this when to take this additional instructions Changed by: Fransisca Kaufmann Raini Tiley, MD   pravastatin 40 MG tablet Commonly known as: PRAVACHOL Take 1 tablet (40 mg total) by mouth daily.   sildenafil 20 MG tablet Commonly known as: REVATIO Take 1-5 tablets (20-100 mg  total) by mouth as needed.   tamsulosin 0.4 MG Caps capsule Commonly known as: FLOMAX Take 1 capsule (0.4 mg total) by mouth daily.         Objective:   BP 130/77   Pulse 85   Ht 5\' 8"  (1.727 m)   Wt 200 lb (90.7 kg)   SpO2 96%   BMI 30.41 kg/m   Wt Readings from Last 3 Encounters:  03/30/22 200 lb (90.7 kg)  11/08/21 195 lb 6.4 oz (88.6 kg)  09/29/21 195 lb (88.5 kg)    Physical Exam Vitals and nursing note reviewed.   Constitutional:      General: He is not in acute distress.    Appearance: He is well-developed. He is not diaphoretic.  Eyes:     General: No scleral icterus.    Conjunctiva/sclera: Conjunctivae normal.  Neck:     Thyroid: No thyromegaly.  Cardiovascular:     Rate and Rhythm: Normal rate and regular rhythm.     Heart sounds: Normal heart sounds. No murmur heard. Pulmonary:     Effort: Pulmonary effort is normal. No respiratory distress.     Breath sounds: Normal breath sounds. No wheezing.  Musculoskeletal:        General: No swelling. Normal range of motion.     Cervical back: Neck supple.  Lymphadenopathy:     Cervical: No cervical adenopathy.  Skin:    General: Skin is warm and dry.     Findings: No rash.  Neurological:     Mental Status: He is alert and oriented to person, place, and time.     Coordination: Coordination normal.  Psychiatric:        Behavior: Behavior normal.     Assessment & Plan:   Problem List Items Addressed This Visit       Cardiovascular and Mediastinum   Essential hypertension   Relevant Medications   hydrochlorothiazide (HYDRODIURIL) 25 MG tablet   lisinopril (ZESTRIL) 40 MG tablet   pravastatin (PRAVACHOL) 40 MG tablet   Other Relevant Orders   CBC with Differential/Platelet   CMP14+EGFR   Lipid panel   Bayer DCA Hb A1c Waived     Digestive   GERD (gastroesophageal reflux disease)   Relevant Medications   omeprazole (PRILOSEC) 40 MG capsule     Other   Hyperlipidemia LDL goal <130 - Primary   Relevant Medications   hydrochlorothiazide (HYDRODIURIL) 25 MG tablet   lisinopril (ZESTRIL) 40 MG tablet   pravastatin (PRAVACHOL) 40 MG tablet   Other Relevant Orders   CBC with Differential/Platelet   CMP14+EGFR   Lipid panel   Bayer DCA Hb A1c Waived   Prediabetes   Relevant Orders   CBC with Differential/Platelet   CMP14+EGFR   Lipid panel   Bayer DCA Hb A1c Waived   Other Visit Diagnoses     Benign prostatic hyperplasia  with weak urinary stream       Relevant Medications   tamsulosin (FLOMAX) 0.4 MG CAPS capsule   Other Relevant Orders   Testosterone,Free and Total   Testosterone deficiency           Continue current medicine, seems to be doing well, refilled his medicines and will check blood work today. Follow up plan: Return in about 6 months (around 09/30/2022), or if symptoms worsen or fail to improve, for Physical exam and recheck hypertension and cholesterol.  Counseling provided for all of the vaccine components Orders Placed This Encounter  Procedures   CBC with Differential/Platelet  CMP14+EGFR   Lipid panel   Bayer DCA Hb A1c Waived   Testosterone,Free and Total    Caryl Pina, MD [facility] 03/30/2022, 10:28 AM

## 2022-04-01 ENCOUNTER — Other Ambulatory Visit: Payer: Medicare HMO

## 2022-04-01 DIAGNOSIS — I10 Essential (primary) hypertension: Secondary | ICD-10-CM | POA: Diagnosis not present

## 2022-04-01 DIAGNOSIS — R3912 Poor urinary stream: Secondary | ICD-10-CM | POA: Diagnosis not present

## 2022-04-01 DIAGNOSIS — R7303 Prediabetes: Secondary | ICD-10-CM | POA: Diagnosis not present

## 2022-04-01 DIAGNOSIS — E785 Hyperlipidemia, unspecified: Secondary | ICD-10-CM | POA: Diagnosis not present

## 2022-04-01 DIAGNOSIS — N401 Enlarged prostate with lower urinary tract symptoms: Secondary | ICD-10-CM | POA: Diagnosis not present

## 2022-04-01 LAB — BAYER DCA HB A1C WAIVED: HB A1C (BAYER DCA - WAIVED): 6.3 % — ABNORMAL HIGH (ref 4.8–5.6)

## 2022-04-01 LAB — LIPID PANEL

## 2022-04-03 LAB — LIPID PANEL
Chol/HDL Ratio: 3.9 ratio (ref 0.0–5.0)
Cholesterol, Total: 144 mg/dL (ref 100–199)
HDL: 37 mg/dL — ABNORMAL LOW (ref >39)
LDL Chol Calc (NIH): 86 mg/dL (ref 0–99)
Triglycerides: 114 mg/dL (ref 0–149)
VLDL Cholesterol Cal: 21 mg/dL (ref 5–40)

## 2022-04-03 LAB — CBC WITH DIFFERENTIAL/PLATELET
Basophils Absolute: 0 10*3/uL (ref 0.0–0.2)
Basos: 0 %
EOS (ABSOLUTE): 0.1 10*3/uL (ref 0.0–0.4)
Eos: 2 %
Hematocrit: 42.9 % (ref 37.5–51.0)
Hemoglobin: 14.3 g/dL (ref 13.0–17.7)
Immature Grans (Abs): 0 10*3/uL (ref 0.0–0.1)
Immature Granulocytes: 0 %
Lymphocytes Absolute: 1.8 10*3/uL (ref 0.7–3.1)
Lymphs: 34 %
MCH: 31.4 pg (ref 26.6–33.0)
MCHC: 33.3 g/dL (ref 31.5–35.7)
MCV: 94 fL (ref 79–97)
Monocytes Absolute: 0.5 10*3/uL (ref 0.1–0.9)
Monocytes: 9 %
Neutrophils Absolute: 2.9 10*3/uL (ref 1.4–7.0)
Neutrophils: 55 %
Platelets: 181 10*3/uL (ref 150–450)
RBC: 4.55 x10E6/uL (ref 4.14–5.80)
RDW: 13.3 % (ref 11.6–15.4)
WBC: 5.3 10*3/uL (ref 3.4–10.8)

## 2022-04-03 LAB — CMP14+EGFR
ALT: 12 IU/L (ref 0–44)
AST: 18 IU/L (ref 0–40)
Albumin/Globulin Ratio: 1.6 (ref 1.2–2.2)
Albumin: 4.4 g/dL (ref 3.8–4.8)
Alkaline Phosphatase: 90 IU/L (ref 44–121)
BUN/Creatinine Ratio: 13 (ref 10–24)
BUN: 14 mg/dL (ref 8–27)
Bilirubin Total: 0.3 mg/dL (ref 0.0–1.2)
CO2: 20 mmol/L (ref 20–29)
Calcium: 9.1 mg/dL (ref 8.6–10.2)
Chloride: 99 mmol/L (ref 96–106)
Creatinine, Ser: 1.06 mg/dL (ref 0.76–1.27)
Globulin, Total: 2.8 g/dL (ref 1.5–4.5)
Glucose: 146 mg/dL — ABNORMAL HIGH (ref 70–99)
Potassium: 3.5 mmol/L (ref 3.5–5.2)
Sodium: 136 mmol/L (ref 134–144)
Total Protein: 7.2 g/dL (ref 6.0–8.5)
eGFR: 75 mL/min/{1.73_m2} (ref >59)

## 2022-04-03 LAB — TESTOSTERONE,FREE AND TOTAL
Testosterone, Free: 6 pg/mL — ABNORMAL LOW (ref 6.6–18.1)
Testosterone: 384 ng/dL (ref 264–916)

## 2022-04-08 ENCOUNTER — Other Ambulatory Visit: Payer: Self-pay | Admitting: Family Medicine

## 2022-04-08 DIAGNOSIS — J069 Acute upper respiratory infection, unspecified: Secondary | ICD-10-CM

## 2022-04-08 DIAGNOSIS — R059 Cough, unspecified: Secondary | ICD-10-CM

## 2022-05-12 ENCOUNTER — Encounter: Payer: Self-pay | Admitting: Family Medicine

## 2022-05-12 ENCOUNTER — Ambulatory Visit (INDEPENDENT_AMBULATORY_CARE_PROVIDER_SITE_OTHER): Payer: Medicare HMO | Admitting: Family Medicine

## 2022-05-12 VITALS — BP 116/80 | HR 72 | Temp 98.1°F | Ht 68.0 in | Wt 200.1 lb

## 2022-05-12 DIAGNOSIS — R42 Dizziness and giddiness: Secondary | ICD-10-CM

## 2022-05-12 DIAGNOSIS — R6889 Other general symptoms and signs: Secondary | ICD-10-CM | POA: Diagnosis not present

## 2022-05-12 LAB — TSH

## 2022-05-12 NOTE — Patient Instructions (Signed)
Dizziness Dizziness is a common problem. It is a feeling of unsteadiness or light-headedness. You may feel like you are about to faint. Dizziness can lead to injury if you stumble or fall. Anyone can become dizzy, but dizziness is more common in older adults. This condition can be caused by a number of things, including medicines, dehydration, or illness. Follow these instructions at home: Eating and drinking  Drink enough fluid to keep your urine pale yellow. This helps to keep you from becoming dehydrated. Try to drink more clear fluids, such as water. Do not drink alcohol. Limit your caffeine intake if told to do so by your health care provider. Check ingredients and nutrition facts to see if a food or beverage contains caffeine. Limit your salt (sodium) intake if told to do so by your health care provider. Check ingredients and nutrition facts to see if a food or beverage contains sodium. Activity  Avoid making quick movements. Rise slowly from chairs and steady yourself until you feel okay. In the morning, first sit up on the side of the bed. When you feel okay, stand slowly while you hold onto something until you know that your balance is good. If you need to stand in one place for a long time, move your legs often. Tighten and relax the muscles in your legs while you are standing. Do not drive or use machinery if you feel dizzy. Avoid bending down if you feel dizzy. Place items in your home so that they are easy for you to reach without leaning over. Lifestyle Do not use any products that contain nicotine or tobacco. These products include cigarettes, chewing tobacco, and vaping devices, such as e-cigarettes. If you need help quitting, ask your health care provider. Try to reduce your stress level by using methods such as yoga or meditation. Talk with your health care provider if you need help to manage your stress. General instructions Watch your dizziness for any changes. Take  over-the-counter and prescription medicines only as told by your health care provider. Talk with your health care provider if you think that your dizziness is caused by a medicine that you are taking. Tell a friend or a family member that you are feeling dizzy. If he or she notices any changes in your behavior, have this person call your health care provider. Keep all follow-up visits. This is important. Contact a health care provider if: Your dizziness does not go away or you have new symptoms. Your dizziness or light-headedness gets worse. You feel nauseous. You have reduced hearing. You have a fever. You have neck pain or a stiff neck. Your dizziness leads to an injury or a fall. Get help right away if: You vomit or have diarrhea and are unable to eat or drink anything. You have problems talking, walking, swallowing, or using your arms, hands, or legs. You feel generally weak. You have any bleeding. You are not thinking clearly or you have trouble forming sentences. It may take a friend or family member to notice this. You have chest pain, abdominal pain, shortness of breath, or sweating. Your vision changes or you develop a severe headache. These symptoms may represent a serious problem that is an emergency. Do not wait to see if the symptoms will go away. Get medical help right away. Call your local emergency services (911 in the [place] Do not drive yourself to the hospital. Summary Dizziness is a feeling of unsteadiness or light-headedness. This condition can be caused by a number of   things, including medicines, dehydration, or illness. Anyone can become dizzy, but dizziness is more common in older adults. Drink enough fluid to keep your urine pale yellow. Do not drink alcohol. Avoid making quick movements if you feel dizzy. Monitor your dizziness for any changes. This information is not intended to replace advice given to you by your health care provider. Make sure you discuss any  questions you have with your health care provider. Document Revised: 12/02/2019 Document Reviewed: 12/02/2019 Elsevier Patient Education  2023 Elsevier Inc.  

## 2022-05-12 NOTE — Progress Notes (Signed)
Acute Office Visit  Subjective:     Patient ID: Johnathan [name], male    DOB: 1950/06/23, 72 y.o.   MRN: [id number]  Chief Complaint  Patient presents with   Dizziness    HPI Patient is in today for dizziness for 3-4 weeks. It feels like a lightheadedness that lasts for just a few a minutes. He noticed that this happens at the gym or right after. He rests for a few minutes and the dizziness resolves. This started right after he was started on metformin by the [place]. Reports his A1c was 6.5 when he was started on this. He denies shortness of breath, weakness, palpitations, chest pain, visual changes, HA, edema, shakiness, or nausea. He does check his blood sugar or eat a snack prior to exercise. He drinks about 16-24 oz of water a day.   ROS As per HPI.      Objective:    BP 116/80   Pulse 72   Temp 98.1 F (36.7 C) (Temporal)   Ht 5\' 8"  (1.727 m)   Wt 200 lb 2 oz (90.8 kg)   SpO2 97%   BMI 30.43 kg/m    Physical Exam Vitals and nursing note reviewed.  Constitutional:      General: He is not in acute distress.    Appearance: Normal appearance. He is not ill-appearing, toxic-appearing or diaphoretic.  HENT:     Head: Normocephalic and atraumatic.     Nose: Nose normal.     Mouth/Throat:     Mouth: Mucous membranes are moist.     Pharynx: Oropharynx is clear.  Eyes:     Extraocular Movements: Extraocular movements intact.     Pupils: Pupils are equal, round, and reactive to light.  Neck:     Vascular: No carotid bruit.  Cardiovascular:     Rate and Rhythm: Normal rate and regular rhythm.     Heart sounds: Normal heart sounds. No murmur heard. Pulmonary:     Effort: Pulmonary effort is normal. No respiratory distress.     Breath sounds: Normal breath sounds.  Abdominal:     General: Bowel sounds are normal. There is no distension.     Palpations: Abdomen is soft.     Tenderness: There is no abdominal tenderness. There is no guarding or rebound.  Musculoskeletal:      Cervical back: Neck supple. No rigidity.     Right lower leg: No edema.     Left lower leg: No edema.  Skin:    General: Skin is warm and dry.  Neurological:     General: No focal deficit present.     Mental Status: He is alert and oriented to person, place, and time.     Cranial Nerves: No cranial nerve deficit.     Motor: No weakness.     Coordination: Coordination normal.     Gait: Gait normal.  Psychiatric:        Mood and Affect: Mood normal.        Behavior: Behavior normal.        Thought Content: Thought content normal.        Judgment: Judgment normal.     No results found for any visits on 05/12/22.      Assessment & Plan:   Johnathan [name] was seen today for dizziness.  Diagnoses and all orders for this visit:  Dizziness Started when metformin was started. Discussed hypoglycemia vs side effect as no other symptoms occur. Discussed checking blood sugar when  dizziness occurs. Also discussed hydration, snack prior to exercise. Will check labs as below today. Discussed zio if symptoms persist or worsen. Strict return precautions given.  -     BMP8+EGFR -     CBC with Differential/Platelet -     TSH  Return if symptoms worsen or fail to improve.  The patient indicates understanding of these issues and agrees with the plan.  Gabriel Earing, FNP

## 2022-05-13 LAB — CBC WITH DIFFERENTIAL/PLATELET
Basophils Absolute: 0 10*3/uL (ref 0.0–0.2)
Basos: 1 %
EOS (ABSOLUTE): 0.1 10*3/uL (ref 0.0–0.4)
Eos: 2 %
Hematocrit: 39 % (ref 37.5–51.0)
Hemoglobin: 13.1 g/dL (ref 13.0–17.7)
Immature Grans (Abs): 0 10*3/uL (ref 0.0–0.1)
Immature Granulocytes: 0 %
Lymphocytes Absolute: 2 10*3/uL (ref 0.7–3.1)
Lymphs: 33 %
MCH: 31.4 pg (ref 26.6–33.0)
MCHC: 33.6 g/dL (ref 31.5–35.7)
MCV: 94 fL (ref 79–97)
Monocytes Absolute: 0.5 10*3/uL (ref 0.1–0.9)
Monocytes: 8 %
Neutrophils Absolute: 3.4 10*3/uL (ref 1.4–7.0)
Neutrophils: 56 %
Platelets: 177 10*3/uL (ref 150–450)
RBC: 4.17 x10E6/uL (ref 4.14–5.80)
RDW: 13 % (ref 11.6–15.4)
WBC: 6 10*3/uL (ref 3.4–10.8)

## 2022-05-13 LAB — BMP8+EGFR
BUN/Creatinine Ratio: 17 (ref 10–24)
BUN: 21 mg/dL (ref 8–27)
CO2: 21 mmol/L (ref 20–29)
Calcium: 9 mg/dL (ref 8.6–10.2)
Chloride: 101 mmol/L (ref 96–106)
Creatinine, Ser: 1.25 mg/dL (ref 0.76–1.27)
Glucose: 121 mg/dL — ABNORMAL HIGH (ref 70–99)
Potassium: 4 mmol/L (ref 3.5–5.2)
Sodium: 136 mmol/L (ref 134–144)
eGFR: 61 mL/min/{1.73_m2} (ref >59)

## 2022-05-17 ENCOUNTER — Encounter: Payer: Self-pay | Admitting: *Deleted

## 2022-05-23 DIAGNOSIS — N529 Male erectile dysfunction, unspecified: Secondary | ICD-10-CM | POA: Diagnosis not present

## 2022-05-23 DIAGNOSIS — N400 Enlarged prostate without lower urinary tract symptoms: Secondary | ICD-10-CM | POA: Diagnosis not present

## 2022-05-23 DIAGNOSIS — E785 Hyperlipidemia, unspecified: Secondary | ICD-10-CM | POA: Diagnosis not present

## 2022-05-23 DIAGNOSIS — I129 Hypertensive chronic kidney disease with stage 1 through stage 4 chronic kidney disease, or unspecified chronic kidney disease: Secondary | ICD-10-CM | POA: Diagnosis not present

## 2022-05-23 DIAGNOSIS — J309 Allergic rhinitis, unspecified: Secondary | ICD-10-CM | POA: Diagnosis not present

## 2022-05-23 DIAGNOSIS — M1990 Unspecified osteoarthritis, unspecified site: Secondary | ICD-10-CM | POA: Diagnosis not present

## 2022-05-23 DIAGNOSIS — Z008 Encounter for other general examination: Secondary | ICD-10-CM | POA: Diagnosis not present

## 2022-05-23 DIAGNOSIS — N182 Chronic kidney disease, stage 2 (mild): Secondary | ICD-10-CM | POA: Diagnosis not present

## 2022-05-23 DIAGNOSIS — E669 Obesity, unspecified: Secondary | ICD-10-CM | POA: Diagnosis not present

## 2022-05-23 DIAGNOSIS — Z6830 Body mass index (BMI) 30.0-30.9, adult: Secondary | ICD-10-CM | POA: Diagnosis not present

## 2022-05-23 DIAGNOSIS — R42 Dizziness and giddiness: Secondary | ICD-10-CM | POA: Diagnosis not present

## 2022-05-23 DIAGNOSIS — E1122 Type 2 diabetes mellitus with diabetic chronic kidney disease: Secondary | ICD-10-CM | POA: Diagnosis not present

## 2022-05-23 DIAGNOSIS — K219 Gastro-esophageal reflux disease without esophagitis: Secondary | ICD-10-CM | POA: Diagnosis not present

## 2022-10-28 ENCOUNTER — Ambulatory Visit (INDEPENDENT_AMBULATORY_CARE_PROVIDER_SITE_OTHER): Payer: Medicare HMO | Admitting: Family Medicine

## 2022-10-28 ENCOUNTER — Encounter: Payer: Self-pay | Admitting: Family Medicine

## 2022-10-28 VITALS — BP 126/80 | HR 85 | Ht 68.0 in | Wt 201.0 lb

## 2022-10-28 DIAGNOSIS — Z23 Encounter for immunization: Secondary | ICD-10-CM

## 2022-10-28 DIAGNOSIS — E785 Hyperlipidemia, unspecified: Secondary | ICD-10-CM | POA: Diagnosis not present

## 2022-10-28 DIAGNOSIS — R7303 Prediabetes: Secondary | ICD-10-CM

## 2022-10-28 DIAGNOSIS — I10 Essential (primary) hypertension: Secondary | ICD-10-CM | POA: Diagnosis not present

## 2022-10-28 DIAGNOSIS — Z0000 Encounter for general adult medical examination without abnormal findings: Secondary | ICD-10-CM | POA: Diagnosis not present

## 2022-10-28 DIAGNOSIS — K219 Gastro-esophageal reflux disease without esophagitis: Secondary | ICD-10-CM

## 2022-10-28 LAB — BAYER DCA HB A1C WAIVED: HB A1C (BAYER DCA - WAIVED): 6.4 % — ABNORMAL HIGH (ref 4.8–5.6)

## 2022-10-28 NOTE — Progress Notes (Signed)
BP 126/80   Pulse 85   Ht 5\' 8"  (1.727 m)   Wt 201 lb (91.2 kg)   SpO2 97%   BMI 30.56 kg/m    Subjective:   Patient ID: Johnathan [name], male    DOB: Aug 04, 1950, 72 y.o.   MRN: [id number]  HPI: Johnathan [name] is a 72 y.o. male presenting on 10/28/2022 for Medical Management of Chronic Issues (CPE)   HPI Physical exam Patient denies any chest pain, shortness of breath, headaches or vision issues, abdominal complaints, diarrhea, nausea, vomiting, or joint issues.   Hypertension Patient is currently on lisinopril hydrochlorothiazide, and their blood pressure today is 126/80. Patient denies any lightheadedness or dizziness. Patient denies headaches, blurred vision, chest pains, shortness of breath, or weakness. Denies any side effects from medication and is content with current medication.   Hyperlipidemia Patient is coming in for recheck of his hyperlipidemia. The patient is currently taking pravastatin. They deny any issues with myalgias or history of liver damage from it. They deny any focal numbness or weakness or chest pain.   Prediabetes Patient comes in today for recheck of his diabetes. Patient has been currently taking metformin, it was started by the [place] 3 months ago. Patient is currently on an ACE inhibitor/ARB. Patient has seen an ophthalmologist this year. Patient denies any new issues with their feet. The symptom started onset as an adult hypertension and hyperlipidemia ARE RELATED TO DM   GERD Patient is currently on omeprazole.  She denies any major symptoms or abdominal pain or belching or burping. She denies any blood in her stool or lightheadedness or dizziness.   Relevant past medical, surgical, family and social history reviewed and updated as indicated. Interim medical history since our last visit reviewed. Allergies and medications reviewed and updated.  Review of Systems  Constitutional:  Negative for chills and fever.  HENT:  Negative for ear pain and  tinnitus.   Eyes:  Negative for pain and discharge.  Respiratory:  Negative for cough, shortness of breath and wheezing.   Cardiovascular:  Negative for chest pain, palpitations and leg swelling.  Gastrointestinal:  Negative for abdominal pain, blood in stool, constipation and diarrhea.  Genitourinary:  Negative for dysuria and hematuria.  Musculoskeletal:  Negative for back pain, gait problem and myalgias.  Skin:  Negative for rash.  Neurological:  Negative for dizziness, weakness and headaches.  Psychiatric/Behavioral:  Negative for suicidal ideas.   All other systems reviewed and are negative.   Per HPI unless specifically indicated above   Allergies as of 10/28/2022   No Known Allergies      Medication List        Accurate as of October 28, 2022  1:51 PM. If you have any questions, ask your nurse or doctor.          fluticasone 50 MCG/ACT nasal spray Commonly known as: FLONASE SPRAY 2 SPRAYS INTO EACH NOSTRIL EVERY DAY   GLUCOSAMINE 1500 COMPLEX PO Take by mouth.   hydrochlorothiazide 25 MG tablet Commonly known as: HYDRODIURIL Take 1 tablet (25 mg total) by mouth daily.   lisinopril 40 MG tablet Commonly known as: ZESTRIL Take 1 tablet (40 mg total) by mouth daily.   meclizine 25 MG tablet Commonly known as: ANTIVERT Take 1 tablet (25 mg total) by mouth 3 (three) times daily as needed for dizziness.   metformin 500 MG (OSM) 24 hr tablet Commonly known as: FORTAMET Take 500 mg by mouth 2 (two) times daily with  a meal.   omeprazole 40 MG capsule Commonly known as: PRILOSEC Take 1 capsule (40 mg total) by mouth daily.   pravastatin 40 MG tablet Commonly known as: PRAVACHOL Take 1 tablet (40 mg total) by mouth daily.   sildenafil 20 MG tablet Commonly known as: REVATIO Take 1-5 tablets (20-100 mg total) by mouth as needed.   tamsulosin 0.4 MG Caps capsule Commonly known as: FLOMAX Take 1 capsule (0.4 mg total) by mouth daily.          Objective:   BP 126/80   Pulse 85   Ht 5\' 8"  (1.727 m)   Wt 201 lb (91.2 kg)   SpO2 97%   BMI 30.56 kg/m   Wt Readings from Last 3 Encounters:  10/28/22 201 lb (91.2 kg)  05/12/22 200 lb 2 oz (90.8 kg)  03/30/22 200 lb (90.7 kg)    Physical Exam Vitals and nursing note reviewed.  Constitutional:      General: He is not in acute distress.    Appearance: He is well-developed. He is not diaphoretic.  HENT:     Right Ear: External ear normal.     Left Ear: External ear normal.     Nose: Nose normal.     Mouth/Throat:     Pharynx: No oropharyngeal exudate.  Eyes:     General: No scleral icterus.       Right eye: No discharge.     Conjunctiva/sclera: Conjunctivae normal.     Pupils: Pupils are equal, round, and reactive to light.  Neck:     Thyroid: No thyromegaly.  Cardiovascular:     Rate and Rhythm: Normal rate and regular rhythm.     Heart sounds: Normal heart sounds. No murmur heard. Pulmonary:     Effort: Pulmonary effort is normal. No respiratory distress.     Breath sounds: Normal breath sounds. No wheezing.  Abdominal:     General: Bowel sounds are normal. There is no distension.     Palpations: Abdomen is soft.     Tenderness: There is no abdominal tenderness. There is no guarding or rebound.  Genitourinary:    Prostate: Enlarged. Not tender and no nodules present.     Rectum: External hemorrhoid (Small and nonbleeding) present. No tenderness or internal hemorrhoid. Normal anal tone.  Musculoskeletal:        General: Normal range of motion.     Cervical back: Neck supple.  Lymphadenopathy:     Cervical: No cervical adenopathy.  Skin:    General: Skin is warm and dry.     Findings: No rash.  Neurological:     Mental Status: He is alert and oriented to person, place, and time.     Coordination: Coordination normal.  Psychiatric:        Behavior: Behavior normal.       Assessment & Plan:   Problem List Items Addressed This Visit        Cardiovascular and Mediastinum   Essential hypertension   Relevant Orders   Bayer DCA Hb A1c Waived   CBC with Differential/Platelet   CMP14+EGFR   Lipid panel   PSA, total and free     Digestive   GERD (gastroesophageal reflux disease)   Relevant Orders   Bayer DCA Hb A1c Waived   CBC with Differential/Platelet   CMP14+EGFR   Lipid panel   PSA, total and free     Other   Hyperlipidemia LDL goal <130   Relevant Orders   Bayer [facility] [place]  Hb A1c Waived   CBC with Differential/Platelet   CMP14+EGFR   Lipid panel   PSA, total and free   Prediabetes   Relevant Orders   Bayer DCA Hb A1c Waived   CBC with Differential/Platelet   CMP14+EGFR   Lipid panel   PSA, total and free   Other Visit Diagnoses     Physical exam    -  Primary   Relevant Orders   Bayer DCA Hb A1c Waived   CBC with Differential/Platelet   CMP14+EGFR   Lipid panel   PSA, total and free       Continue current medicine for now, will check blood work today.  He blood pressure and everything else looks good Follow up plan: Return in about 6 months (around 04/28/2023), or if symptoms worsen or fail to improve, for Prediabetes and hypertension recheck.  Counseling provided for all of the vaccine components Orders Placed This Encounter  Procedures   Bayer DCA Hb A1c Waived   CBC with Differential/Platelet   CMP14+EGFR   Lipid panel   PSA, total and free    Arville Care, MD [facility] 10/28/2022, 1:51 PM

## 2022-10-29 LAB — CMP14+EGFR
ALT: 16 [IU]/L (ref 0–44)
AST: 22 [IU]/L (ref 0–40)
Albumin: 4.5 g/dL (ref 3.8–4.8)
Alkaline Phosphatase: 70 [IU]/L (ref 44–121)
BUN/Creatinine Ratio: 14 (ref 10–24)
BUN: 13 mg/dL (ref 8–27)
Bilirubin Total: 0.5 mg/dL (ref 0.0–1.2)
CO2: 22 mmol/L (ref 20–29)
Calcium: 9.1 mg/dL (ref 8.6–10.2)
Chloride: 100 mmol/L (ref 96–106)
Creatinine, Ser: 0.94 mg/dL (ref 0.76–1.27)
Globulin, Total: 2.7 g/dL (ref 1.5–4.5)
Glucose: 111 mg/dL — ABNORMAL HIGH (ref 70–99)
Potassium: 3.9 mmol/L (ref 3.5–5.2)
Sodium: 139 mmol/L (ref 134–144)
Total Protein: 7.2 g/dL (ref 6.0–8.5)
eGFR: 86 mL/min/{1.73_m2} (ref >59)

## 2022-10-29 LAB — CBC WITH DIFFERENTIAL/PLATELET
Basophils Absolute: 0 10*3/uL (ref 0.0–0.2)
Basos: 1 %
EOS (ABSOLUTE): 0.1 10*3/uL (ref 0.0–0.4)
Eos: 1 %
Hematocrit: 37 % — ABNORMAL LOW (ref 37.5–51.0)
Hemoglobin: 12 g/dL — ABNORMAL LOW (ref 13.0–17.7)
Immature Grans (Abs): 0 10*3/uL (ref 0.0–0.1)
Immature Granulocytes: 0 %
Lymphocytes Absolute: 1.7 10*3/uL (ref 0.7–3.1)
Lymphs: 28 %
MCH: 29.2 pg (ref 26.6–33.0)
MCHC: 32.4 g/dL (ref 31.5–35.7)
MCV: 90 fL (ref 79–97)
Monocytes Absolute: 0.5 10*3/uL (ref 0.1–0.9)
Monocytes: 8 %
Neutrophils Absolute: 3.8 10*3/uL (ref 1.4–7.0)
Neutrophils: 62 %
Platelets: 222 10*3/uL (ref 150–450)
RBC: 4.11 x10E6/uL — ABNORMAL LOW (ref 4.14–5.80)
RDW: 13.3 % (ref 11.6–15.4)
WBC: 6.1 10*3/uL (ref 3.4–10.8)

## 2022-10-29 LAB — LIPID PANEL
Chol/HDL Ratio: 3.9 {ratio} (ref 0.0–5.0)
Cholesterol, Total: 148 mg/dL (ref 100–199)
HDL: 38 mg/dL — ABNORMAL LOW (ref >39)
LDL Chol Calc (NIH): 81 mg/dL (ref 0–99)
Triglycerides: 165 mg/dL — ABNORMAL HIGH (ref 0–149)
VLDL Cholesterol Cal: 29 mg/dL (ref 5–40)

## 2022-10-29 LAB — PSA, TOTAL AND FREE
PSA, Free Pct: 21 %
PSA, Free: 1.03 ng/mL
Prostate Specific Ag, Serum: 4.9 ng/mL — ABNORMAL HIGH (ref 0.0–4.0)

## 2022-12-02 ENCOUNTER — Ambulatory Visit (INDEPENDENT_AMBULATORY_CARE_PROVIDER_SITE_OTHER): Payer: Medicare HMO

## 2022-12-02 VITALS — Ht 68.0 in | Wt 200.0 lb

## 2022-12-02 DIAGNOSIS — Z0000 Encounter for general adult medical examination without abnormal findings: Secondary | ICD-10-CM | POA: Diagnosis not present

## 2022-12-02 NOTE — Progress Notes (Signed)
 Because this visit was a virtual/telehealth visit,  certain criteria was not obtained, such a blood pressure, CBG if applicable, and timed get up and go. Any medications not marked as "taking" were not mentioned during the medication reconciliation part of the visit. Any vitals not documented were not able to be obtained due to this being a telehealth visit or patient was unable to self-report a recent blood pressure reading due to a lack of equipment at home via telehealth. Vitals that have been documented are verbally provided by the patient.   Subjective:   Johnathan [name] is a 72 y.o. male who presents for Medicare Annual/Subsequent preventive examination.  Visit Complete: Virtual I connected with  [name] on 12/02/22 by a audio enabled telemedicine application and verified that I am speaking with the correct person using two identifiers.  Patient Location: Home  Provider Location: Home Office  I discussed the limitations of evaluation and management by telemedicine. The patient expressed understanding and agreed to proceed.  Vital Signs: Because this visit was a virtual/telehealth visit, some criteria may be missing or patient reported. Any vitals not documented were not able to be obtained and vitals that have been documented are patient reported.  Patient Medicare AWV questionnaire was completed by the patient on na; I have confirmed that all information answered by patient is correct and no changes since this date.  Cardiac Risk Factors include: advanced age (>38men, >71 women);diabetes mellitus;dyslipidemia;hypertension;male gender;obesity (BMI >30kg/m2);sedentary lifestyle     Objective:    Today's Vitals   12/02/22 1447  Weight: 200 lb (90.7 kg)  Height: 5\' 8"  (1.727 m)   Body mass index is 30.41 kg/m.     12/02/2022    2:50 PM 09/24/2021   12:11 PM 09/01/2020    4:57 PM 12/20/2018    2:57 PM 07/06/2018    9:53 AM 12/28/2015    8:11 AM 12/22/2015    3:37 PM   Advanced Directives  Does Patient Have a Medical Advance Directive? No Yes Yes Yes No Yes Yes  Type of Special educational needs teacher of [place];Living will Healthcare Power of [place];Living will Healthcare Power of [place];Living will  Living will Living will  Does patient want to make changes to medical advance directive?      No - Patient declined   Copy of Healthcare Power of Attorney in Chart?  No - copy requested No - copy requested      Would patient like information on creating a medical advance directive? No - Patient declined    Yes (MAU/Ambulatory/Procedural Areas - Information given)      Current Medications (verified) Outpatient Encounter Medications as of 12/02/2022  Medication Sig   fluticasone (FLONASE) 50 MCG/ACT nasal spray SPRAY 2 SPRAYS INTO EACH NOSTRIL EVERY DAY   Glucosamine-Chondroit-Vit C-Mn (GLUCOSAMINE 1500 COMPLEX PO) Take by mouth.   hydrochlorothiazide (HYDRODIURIL) 25 MG tablet Take 1 tablet (25 mg total) by mouth daily.   lisinopril (ZESTRIL) 40 MG tablet Take 1 tablet (40 mg total) by mouth daily.   meclizine (ANTIVERT) 25 MG tablet Take 1 tablet (25 mg total) by mouth 3 (three) times daily as needed for dizziness.   metformin (FORTAMET) 500 MG (OSM) 24 hr tablet Take 500 mg by mouth 2 (two) times daily with a meal.   omeprazole (PRILOSEC) 40 MG capsule Take 1 capsule (40 mg total) by mouth daily.   pravastatin (PRAVACHOL) 40 MG tablet Take 1 tablet (40 mg total) by mouth daily.   sildenafil (  REVATIO) 20 MG tablet Take 1-5 tablets (20-100 mg total) by mouth as needed.   tamsulosin (FLOMAX) 0.4 MG CAPS capsule Take 1 capsule (0.4 mg total) by mouth daily.   No facility-administered encounter medications on file as of 12/02/2022.    Allergies (verified) Patient has no known allergies.   History: Past Medical History:  Diagnosis Date   GERD (gastroesophageal reflux disease)    Hyperlipidemia    Hypertension    Past Surgical History:   Procedure Laterality Date   CATARACT EXTRACTION     EXCISION OF SKIN TAG N/A 12/28/2015   Procedure: EXCISION OF ANAL  SKIN TAGS AND 3 SKIN TAGS LEFT THIGH;  Surgeon: Violeta Gelinas, MD;  Location: [facility];  Service: General;  Laterality: N/A;   HEMORRHOID SURGERY  12/28/2015   Procedure: HEMORRHOIDECTOMY x2;  Surgeon: Violeta Gelinas, MD;  Location: [facility];  Service: General;;   POLYPECTOMY     from vocal cords   Family History  Problem Relation Age of Onset   Cancer Mother        breast   Hepatitis C Brother    Social History   Socioeconomic History   Marital status: Married    Spouse name: Aram Beecham   Number of children: 2   Years of education: Not on file   Highest education level: 12th grade  Occupational History   Occupation: Retired  Tobacco Use   Smoking status: Never   Smokeless tobacco: Never  Vaping Use   Vaping status: Never Used  Substance and Sexual Activity   Alcohol use: No   Drug use: No   Sexual activity: Yes  Other Topics Concern   Not on file  Social History Narrative   Lives home with wife - gets VA benefits    Children live nearby   Social Determinants of Health   Financial Resource Strain: Low Risk  (12/02/2022)   Overall Financial Resource Strain (CARDIA)    Difficulty of Paying Living Expenses: Not hard at all  Food Insecurity: No Food Insecurity (12/02/2022)   Hunger Vital Sign    Worried About Running Out of Food in the Last Year: Never true    Ran Out of Food in the Last Year: Never true  Transportation Needs: No Transportation Needs (12/02/2022)   PRAPARE - Administrator, Civil Service (Medical): No    Lack of Transportation (Non-Medical): No  Physical Activity: Sufficiently Active (12/02/2022)   Exercise Vital Sign    Days of Exercise per Week: 4 days    Minutes of Exercise per Session: 60 min  Stress: No Stress Concern Present (12/02/2022)   [employer] of Occupational Health  - Occupational Stress Questionnaire    Feeling of Stress : Not at all  Social Connections: Socially Integrated (12/02/2022)   Social Connection and Isolation Panel [NHANES]    Frequency of Communication with Friends and Family: More than three times a week    Frequency of Social Gatherings with Friends and Family: More than three times a week    Attends Religious Services: More than 4 times per year    Active Member of [employer] or Organizations: Yes    Attends Engineer, structural: More than 4 times per year    Marital Status: Married    Tobacco Counseling Counseling given: Yes   Clinical Intake:  Pre-visit preparation completed: Yes  Pain : No/denies pain     BMI - recorded: 30.41 Nutritional Status: BMI > 30  Obese Nutritional Risks: None Diabetes: Yes CBG done?: No Did pt. bring in CBG monitor from home?: No  How often do you need to have someone help you when you read instructions, pamphlets, or other written materials from your doctor or pharmacy?: 1 - Never  Interpreter Needed?: No  Information entered by ::  Abdurrahman Petersheim, CMA   Activities of Daily Living    12/02/2022    2:48 PM  In your present state of health, do you have any difficulty performing the following activities:  Hearing? 1  Comment wears hearing aids  Vision? 0  Difficulty concentrating or making decisions? 0  Walking or climbing stairs? 0  Dressing or bathing? 0  Doing errands, shopping? 0  Preparing Food and eating ? N  Using the Toilet? N  In the past six months, have you accidently leaked urine? N  Do you have problems with loss of bowel control? N  Managing your Medications? N  Managing your Finances? N  Housekeeping or managing your Housekeeping? N    Patient Care Team: Dettinger, Elige Radon, MD as PCP - General (Family Medicine)  Indicate any recent Medical Services you may have received from other than [facility] providers in the past year (date may be approximate).      Assessment:   This is a routine wellness examination for Jshon.  Hearing/Vision screen Hearing Screening - Comments:: Wears hearing aids  Vision Screening - Comments:: Up to date with exams. [facility] for exams   Goals Addressed             This Visit's Progress    Patient Stated       Remain active and healthy        Depression Screen    12/02/2022    2:51 PM 10/28/2022    1:23 PM 05/12/2022    9:13 AM 03/30/2022   10:21 AM 11/08/2021   10:42 AM 09/29/2021   10:45 AM 09/24/2021   12:10 PM  PHQ 2/9 Scores  PHQ - 2 Score 0 0 1 2 0 0 0  PHQ- 9 Score 0 1 1 2  0 3     Fall Risk    12/02/2022    2:50 PM 10/28/2022    1:23 PM 05/12/2022    9:13 AM 03/30/2022   10:21 AM 11/08/2021   10:42 AM  Fall Risk   Falls in the past year? 0 0 0 0 0  Number falls in past yr: 0      Injury with Fall? 0      Risk for fall due to : No Fall Risks      Follow up Falls prevention discussed        MEDICARE RISK AT HOME: Medicare Risk at Home Any stairs in or around the home?: Yes If so, are there any without handrails?: No Home free of loose throw rugs in walkways, pet beds, electrical cords, etc?: Yes Adequate lighting in your home to reduce risk of falls?: Yes Life alert?: No Use of a cane, walker or w/c?: No Grab bars in the bathroom?: No Shower chair or bench in shower?: No Elevated toilet seat or a handicapped toilet?: No  TIMED UP AND GO:  Was the test performed?  No    Cognitive Function:        12/02/2022    2:51 PM 09/24/2021   12:13 PM 07/06/2018    9:53 AM  6CIT Screen  What Year? 0 points 0 points 0 points  What  month? 0 points 0 points 0 points  What time? 0 points 0 points 0 points  Count back from 20 0 points 0 points 0 points  Months in reverse 0 points 0 points 0 points  Repeat phrase 0 points 0 points 0 points  Total Score 0 points 0 points 0 points    Immunizations Immunization History  Administered Date(s) Administered   DTaP  01/11/2003   Fluad Quad(high Dose 65+) 11/10/2020, 09/29/2021   Fluad Trivalent(High Dose 65+) 10/28/2022   Influenza-Unspecified 10/16/2000   PFIZER Comirnaty(Gray Top)Covid-19 Tri-Sucrose Vaccine 04/01/2019, 05/02/2019, 07/14/2020   Pneumococcal Conjugate-13 06/08/2017   Pneumococcal-Unspecified 06/06/2019   Tdap 12/20/2018   Zoster Recombinant(Shingrix) 02/08/2019, 06/06/2019    TDAP status: Up to date  Flu Vaccine status: Up to date  Pneumococcal vaccine status: Due, Education has been provided regarding the importance of this vaccine. Advised may receive this vaccine at local pharmacy or Health Dept. Aware to provide a copy of the vaccination record if obtained from local pharmacy or Health Dept. Verbalized acceptance and understanding.  Covid-19 vaccine status: Information provided on how to obtain vaccines.   Qualifies for Shingles Vaccine? No   Zostavax completed Yes   Shingrix Completed?: Yes  Screening Tests Health Maintenance  Topic Date Due   Pneumonia Vaccine 75+ Years old (2 of 2 - PPSV23 or PCV20) 06/09/2018   COVID-19 Vaccine (4 - 2023-24 season) 09/11/2022   Medicare Annual Wellness (AWV)  09/25/2022   Colonoscopy  10/02/2023 (Originally 08/15/2016)   DTaP/Tdap/Td (3 - Td or Tdap) 12/19/2028   INFLUENZA VACCINE  Completed   Hepatitis C Screening  Completed   Zoster Vaccines- Shingrix  Completed   HPV VACCINES  Aged Out    Health Maintenance  Health Maintenance Due  Topic Date Due   Pneumonia Vaccine 32+ Years old (2 of 2 - PPSV23 or PCV20) 06/09/2018   COVID-19 Vaccine (4 - 2023-24 season) 09/11/2022   Medicare Annual Wellness (AWV)  09/25/2022    Colorectal Cancer Screening: patient states he had screening last year through the [place]. Will request records.   Lung Cancer Screening: (Low Dose CT Chest recommended if Age 69-80 years, 20 pack-year currently smoking OR have quit w/in 15years.) does not qualify.   Lung Cancer Screening Referral:  na  Additional Screening:  Hepatitis C Screening: does not qualify; Completed   Vision Screening: Recommended annual ophthalmology exams for early detection of glaucoma and other disorders of the eye. Is the patient up to date with their annual eye exam?  Yes  Who is the provider or what is the name of the office in which the patient attends annual eye exams? [facility] If pt is not established with a provider, would they like to be referred to a provider to establish care? No .   Dental Screening: Recommended annual dental exams for proper oral hygiene  Diabetic Foot Exam: na  Community Resource Referral / Chronic Care Management: CRR required this visit?  No   CCM required this visit?  No     Plan:     I have personally reviewed and noted the following in the patient's chart:   Medical and social history Use of alcohol, tobacco or illicit drugs  Current medications and supplements including opioid prescriptions. Patient is not currently taking opioid prescriptions. Functional ability and status Nutritional status Physical activity Advanced directives List of other physicians Hospitalizations, surgeries, and ER visits in previous 12 months Vitals Screenings to include cognitive, depression, and falls Referrals and appointments  In addition, I have reviewed and discussed with patient certain preventive protocols, quality metrics, and best practice recommendations. A written personalized care plan for preventive services as well as general preventive health recommendations were provided to patient.     Jordan Hawks Saara Kijowski, CMA   12/02/2022   After Visit Summary: (Mail) Due to this being a telephonic visit, the after visit summary with patients personalized plan was offered to patient via mail   Nurse Notes: none

## 2022-12-02 NOTE — Patient Instructions (Signed)
Johnathan [name] , Thank you for taking time to come for your Medicare Wellness Visit. I appreciate your ongoing commitment to your health goals. Please review the following plan we discussed and let me know if I can assist you in the future.   Referrals/Orders/Follow-Ups/Clinician Recommendations:  Your next medicare annual wellness visit will be on December 05, 2023 at 1:50 pm. This will be a virtual visit  I will request your colonscopy report from the [place]  You are due for the second dose of the Pneumonia 23 or Pneumonia 20  This is a list of the screening recommended for you and due dates:  Health Maintenance  Topic Date Due   Pneumonia Vaccine (2 of 2 - PPSV23 or PCV20) 06/09/2018   COVID-19 Vaccine (4 - 2023-24 season) 09/11/2022   Colon Cancer Screening  10/02/2023*   Medicare Annual Wellness Visit  12/02/2023   DTaP/Tdap/Td vaccine (3 - Td or Tdap) 12/19/2028   Flu Shot  Completed   Hepatitis C Screening  Completed   Zoster (Shingles) Vaccine  Completed   HPV Vaccine  Aged Out  *Topic was postponed. The date shown is not the original due date.    Advanced directives: (ACP Link)Information on Advanced Care Planning can be found at [facility] of [facility] Advance Health Care Directives Advance Health Care Directives ([email or web address]   Next Medicare Annual Wellness Visit scheduled for next year: Yes  Preventive Care 65 Years and Older, Male Preventive care refers to lifestyle choices and visits with your health care provider that can promote health and wellness. Preventive care visits are also called wellness exams. What can I expect for my preventive care visit? Counseling During your preventive care visit, your health care provider may ask about your: Medical history, including: Past medical problems. Family medical history. History of falls. Current health, including: Emotional well-being. Home life and relationship well-being. Sexual activity. Memory and ability to  understand (cognition). Lifestyle, including: Alcohol, nicotine or tobacco, and drug use. Access to firearms. Diet, exercise, and sleep habits. Work and work Astronomer. Sunscreen use. Safety issues such as seatbelt and bike helmet use. Physical exam Your health care provider will check your: Height and weight. These may be used to calculate your BMI (body mass index). BMI is a measurement that tells if you are at a healthy weight. Waist circumference. This measures the distance around your waistline. This measurement also tells if you are at a healthy weight and may help predict your risk of certain diseases, such as type 2 diabetes and high blood pressure. Heart rate and blood pressure. Body temperature. Skin for abnormal spots. What immunizations do I need?  Vaccines are usually given at various ages, according to a schedule. Your health care provider will recommend vaccines for you based on your age, medical history, and lifestyle or other factors, such as travel or where you work. What tests do I need? Screening Your health care provider may recommend screening tests for certain conditions. This may include: Lipid and cholesterol levels. Diabetes screening. This is done by checking your blood sugar (glucose) after you have not eaten for a while (fasting). Hepatitis C test. Hepatitis B test. HIV (human immunodeficiency virus) test. STI (sexually transmitted infection) testing, if you are at risk. Lung cancer screening. Colorectal cancer screening. Prostate cancer screening. Abdominal aortic aneurysm (AAA) screening. You may need this if you are a current or former smoker. Talk with your health care provider about your test results, treatment options, and if necessary, the need  for more tests. Follow these instructions at home: Eating and drinking  Eat a diet that includes fresh fruits and vegetables, whole grains, lean protein, and low-fat dairy products. Limit your intake of  foods with high amounts of sugar, saturated fats, and salt. Take vitamin and mineral supplements as recommended by your health care provider. Do not drink alcohol if your health care provider tells you not to drink. If you drink alcohol: Limit how much you have to 0-2 drinks a day. Know how much alcohol is in your drink. In the [place] one drink equals one 12 oz bottle of beer (355 mL), one 5 oz glass of wine (148 mL), or one 1 oz glass of hard liquor (44 mL). Lifestyle Brush your teeth every morning and night with fluoride toothpaste. Floss one time each day. Exercise for at least 30 minutes 5 or more days each week. Do not use any products that contain nicotine or tobacco. These products include cigarettes, chewing tobacco, and vaping devices, such as e-cigarettes. If you need help quitting, ask your health care provider. Do not use drugs. If you are sexually active, practice safe sex. Use a condom or other form of protection to prevent STIs. Take aspirin only as told by your health care provider. Make sure that you understand how much to take and what form to take. Work with your health care provider to find out whether it is safe and beneficial for you to take aspirin daily. Ask your health care provider if you need to take a cholesterol-lowering medicine (statin). Find healthy ways to manage stress, such as: Meditation, yoga, or listening to music. Journaling. Talking to a trusted person. Spending time with friends and family. Safety Always wear your seat belt while driving or riding in a vehicle. Do not drive: If you have been drinking alcohol. Do not ride with someone who has been drinking. When you are tired or distracted. While texting. If you have been using any mind-altering substances or drugs. Wear a helmet and other protective equipment during sports activities. If you have firearms in your house, make sure you follow all gun safety procedures. Minimize exposure to UV  radiation to reduce your risk of skin cancer. What's next? Visit your health care provider once a year for an annual wellness visit. Ask your health care provider how often you should have your eyes and teeth checked. Stay up to date on all vaccines. This information is not intended to replace advice given to you by your health care provider. Make sure you discuss any questions you have with your health care provider. Document Revised: 06/24/2020 Document Reviewed: 06/24/2020 Elsevier Patient Education  2024 [employer]. Understanding Your Risk for Falls Millions of people have serious injuries from falls each year. It is important to understand your risk of falling. Talk with your health care provider about your risk and what you can do to lower it. If you do have a serious fall, make sure to tell your provider. Falling once raises your risk of falling again. How can falls affect me? Serious injuries from falls are common. These include: Broken bones, such as hip fractures. Head injuries, such as traumatic brain injuries (TBI) or concussions. A fear of falling can cause you to avoid activities and stay at home. This can make your muscles weaker and raise your risk for a fall. What can increase my risk? There are a number of risk factors that increase your risk for falling. The more risk factors you have,  the higher your risk of falling. Serious injuries from a fall happen most often to people who are older than 73 years old. Teenagers and young adults ages 68-29 are also at higher risk. Common risk factors include: Weakness in the lower body. Being generally weak or confused due to long-term (chronic) illness. Dizziness or balance problems. Poor vision. Medicines that cause dizziness or drowsiness. These may include: Medicines for your blood pressure, heart, anxiety, insomnia, or swelling (edema). Pain medicines. Muscle relaxants. Other risk factors include: Drinking alcohol. Having  had a fall in the past. Having foot pain or wearing improper footwear. Working at a dangerous job. Having any of the following in your home: Tripping hazards, such as floor clutter or loose rugs. Poor lighting. Pets. Having dementia or memory loss. What actions can I take to lower my risk of falling?     Physical activity Stay physically fit. Do strength and balance exercises. Consider taking a regular class to build strength and balance. Yoga and tai chi are good options. Vision Have your eyes checked every year and your prescription for glasses or contacts updated as needed. Shoes and walking aids Wear non-skid shoes. Wear shoes that have rubber soles and low heels. Do not wear high heels. Do not walk around the house in socks or slippers. Use a cane or walker as told by your provider. Home safety Attach secure railings on both sides of your stairs. Install grab bars for your bathtub, shower, and toilet. Use a non-skid mat in your bathtub or shower. Attach bath mats securely with double-sided, non-slip rug tape. Use good lighting in all rooms. Keep a flashlight near your bed. Make sure there is a clear path from your bed to the bathroom. Use night-lights. Do not use throw rugs. Make sure all carpeting is taped or tacked down securely. Remove all clutter from walkways and stairways, including extension cords. Repair uneven or broken steps and floors. Avoid walking on icy or slippery surfaces. Walk on the grass instead of on icy or slick sidewalks. Use ice melter to get rid of ice on walkways in the winter. Use a cordless phone. Questions to ask your health care provider Can you help me check my risk for a fall? Do any of my medicines make me more likely to fall? Should I take a vitamin D supplement? What exercises can I do to improve my strength and balance? Should I make an appointment to have my vision checked? Do I need a bone density test to check for weak bones  (osteoporosis)? Would it help to use a cane or a walker? Where to find more information Centers for Disease Control and Prevention, STEADI: TonerPromos.no Community-Based Fall Prevention Programs: TonerPromos.no [employer] on Aging: BaseRingTones.pl Contact a health care provider if: You fall at home. You are afraid of falling at home. You feel weak, drowsy, or dizzy. This information is not intended to replace advice given to you by your health care provider. Make sure you discuss any questions you have with your health care provider. Document Revised: 08/30/2021 Document Reviewed: 08/30/2021 Elsevier Patient Education  2024 [employer].

## 2023-02-04 ENCOUNTER — Other Ambulatory Visit: Payer: Self-pay | Admitting: Family Medicine

## 2023-02-04 DIAGNOSIS — R059 Cough, unspecified: Secondary | ICD-10-CM

## 2023-02-04 DIAGNOSIS — I10 Essential (primary) hypertension: Secondary | ICD-10-CM

## 2023-02-04 DIAGNOSIS — E785 Hyperlipidemia, unspecified: Secondary | ICD-10-CM

## 2023-02-04 DIAGNOSIS — K219 Gastro-esophageal reflux disease without esophagitis: Secondary | ICD-10-CM

## 2023-02-04 DIAGNOSIS — J069 Acute upper respiratory infection, unspecified: Secondary | ICD-10-CM

## 2023-05-10 ENCOUNTER — Other Ambulatory Visit: Payer: Self-pay | Admitting: Family Medicine

## 2023-05-10 DIAGNOSIS — K219 Gastro-esophageal reflux disease without esophagitis: Secondary | ICD-10-CM

## 2023-05-18 ENCOUNTER — Encounter: Payer: Self-pay | Admitting: Family Medicine

## 2023-05-18 ENCOUNTER — Ambulatory Visit: Payer: Self-pay

## 2023-05-18 ENCOUNTER — Ambulatory Visit: Admitting: Family Medicine

## 2023-05-18 VITALS — BP 117/74 | HR 79 | Temp 98.5°F | Ht 68.0 in | Wt 205.0 lb

## 2023-05-18 DIAGNOSIS — J40 Bronchitis, not specified as acute or chronic: Secondary | ICD-10-CM

## 2023-05-18 MED ORDER — AZITHROMYCIN 250 MG PO TABS: ORAL_TABLET | ORAL | 0 refills | Status: DC

## 2023-05-18 MED ORDER — LEVOCETIRIZINE DIHYDROCHLORIDE 5 MG PO TABS
5.0000 mg | ORAL_TABLET | Freq: Every evening | ORAL | 3 refills | Status: AC
Start: 2023-05-18 — End: ?

## 2023-05-18 NOTE — Telephone Encounter (Signed)
 Chief Complaint: cough Symptoms: cough and chest congestion Frequency: x weeks Pertinent Negatives: Patient denies fever Disposition: [] ED /[] Urgent Care (no appt availability in office) / [x] Appointment(In office/virtual)/ []  [facility]/ [] Home Care/ [] Refused Recommended Disposition /[]  Mobile Bus/ []  Follow-up with PCP Additional Notes: Pt states he developed a cough a few weeks ago and then yesterday he developed some congestion. States he took some Nyquil and it did help some. Denies sob or fever.   Copied from CRM [id number]. Topic: Clinical - Red Word Triage >> May 18, 2023  8:04 AM Danelle Dunning F wrote: [facility] that prompted transfer to Nurse Triage:  Productive cough and congestion since yesterday Reason for Disposition  Cough has been present for > 3 weeks  Answer Assessment - Initial Assessment Questions 1. ONSET: "When did the cough begin?"      Weeks ago 2. SEVERITY: "How bad is the cough today?"      mod 3. SPUTUM: "Describe the color of your sputum" (none, dry cough; clear, white, yellow, green)     none 4. HEMOPTYSIS: "Are you coughing up any blood?" If so ask: "How much?" (flecks, streaks, tablespoons, etc.)     no 5. DIFFICULTY BREATHING: "Are you having difficulty breathing?" If Yes, ask: "How bad is it?" (e.g., mild, moderate, severe)    - MILD: No SOB at rest, mild SOB with walking, speaks normally in sentences, can lie down, no retractions, pulse < 100.    - MODERATE: SOB at rest, SOB with minimal exertion and prefers to sit, cannot lie down flat, speaks in phrases, mild retractions, audible wheezing, pulse 100-120.    - SEVERE: Very SOB at rest, speaks in single words, struggling to breathe, sitting hunched forward, retractions, pulse > 120      no 6. FEVER: "Do you have a fever?" If Yes, ask: "What is your temperature, how was it measured, and when did it start?"     no 7. CARDIAC HISTORY: "Do you have any history of heart disease?" (e.g., heart  attack, congestive heart failure)      no 8. LUNG HISTORY: "Do you have any history of lung disease?"  (e.g., pulmonary embolus, asthma, emphysema)     no 9. PE RISK FACTORS: "Do you have a history of blood clots?" (or: recent major surgery, recent prolonged travel, bedridden)     no 10. OTHER SYMPTOMS: "Do you have any other symptoms?" (e.g., runny nose, wheezing, chest pain)       no  Protocols used: Cough - Acute Productive-A-AH

## 2023-05-18 NOTE — Progress Notes (Signed)
 Acute Office Visit  Subjective:     Patient ID: Johnathan [name], male    DOB: 06/24/1950, 73 y.o.   MRN: [id number]  Chief Complaint  Patient presents with   Cough    Cough This is a new problem. The current episode started in the past 7 days. The problem has been gradually worsening. The cough is Productive of sputum (green). Associated symptoms include headaches (sinus pressure), nasal congestion, a sore throat and wheezing. Pertinent negatives include no chest pain, chills, ear congestion, ear pain, fever or shortness of breath. Treatments tried: nyquil. The treatment provided no relief. There is no history of asthma, bronchitis, COPD or pneumonia.    Review of Systems  Constitutional:  Negative for chills and fever.  HENT:  Positive for sore throat. Negative for ear pain.   Respiratory:  Positive for cough and wheezing. Negative for shortness of breath.   Cardiovascular:  Negative for chest pain.  Neurological:  Positive for headaches (sinus pressure).        Objective:    BP 117/74   Pulse 79   Temp 98.5 F (36.9 C) (Temporal)   Ht 5\' 8"  (1.727 m)   Wt 205 lb (93 kg)   SpO2 97%   BMI 31.17 kg/m    Physical Exam Vitals and nursing note reviewed.  Constitutional:      General: He is not in acute distress.    Appearance: He is not ill-appearing, toxic-appearing or diaphoretic.  HENT:     Nose: Congestion present.     Right Sinus: No maxillary sinus tenderness or frontal sinus tenderness.     Left Sinus: No maxillary sinus tenderness or frontal sinus tenderness.     Mouth/Throat:     Mouth: Mucous membranes are moist.     Pharynx: Oropharynx is clear. No oropharyngeal exudate or posterior oropharyngeal erythema.     Tonsils: No tonsillar exudate or tonsillar abscesses.  Eyes:     General:        Right eye: No discharge.     Conjunctiva/sclera: Conjunctivae normal.  Cardiovascular:     Rate and Rhythm: Normal rate and regular rhythm.     Heart sounds:  Normal heart sounds. No murmur heard. Pulmonary:     Effort: Pulmonary effort is normal. No respiratory distress.     Breath sounds: Normal breath sounds. No wheezing, rhonchi or rales.  Chest:     Chest wall: No tenderness.  Musculoskeletal:     Cervical back: Neck supple. No rigidity.     Right lower leg: No edema.     Left lower leg: No edema.  Skin:    General: Skin is warm and dry.  Neurological:     General: No focal deficit present.     Mental Status: He is alert and oriented to person, place, and time.  Psychiatric:        Mood and Affect: Mood normal.        Behavior: Behavior normal.     No results found for any visits on 05/18/23.      Assessment & Plan:   Mckenley was seen today for cough.  Diagnoses and all orders for this visit:  Bronchitis Zpak as below. Xyzal nightly prn. Discussed symptomatic care and return precautions.  -     azithromycin  (ZITHROMAX  Z-PAK) 250 MG tablet; As directed -     levocetirizine (XYZAL) 5 MG tablet; Take 1 tablet (5 mg total) by mouth every evening.    Return if  symptoms worsen or fail to improve.  The patient indicates understanding of these issues and agrees with the plan.  Albertha Huger, FNP

## 2023-06-01 ENCOUNTER — Other Ambulatory Visit: Payer: Self-pay | Admitting: Family Medicine

## 2023-06-01 DIAGNOSIS — I10 Essential (primary) hypertension: Secondary | ICD-10-CM

## 2023-06-01 DIAGNOSIS — E785 Hyperlipidemia, unspecified: Secondary | ICD-10-CM

## 2023-06-02 ENCOUNTER — Other Ambulatory Visit: Payer: Self-pay | Admitting: Family Medicine

## 2023-06-02 DIAGNOSIS — K219 Gastro-esophageal reflux disease without esophagitis: Secondary | ICD-10-CM

## 2023-06-03 ENCOUNTER — Other Ambulatory Visit: Payer: Self-pay | Admitting: Family Medicine

## 2023-06-03 DIAGNOSIS — I10 Essential (primary) hypertension: Secondary | ICD-10-CM

## 2023-06-03 DIAGNOSIS — N401 Enlarged prostate with lower urinary tract symptoms: Secondary | ICD-10-CM

## 2023-06-24 ENCOUNTER — Other Ambulatory Visit: Payer: Self-pay | Admitting: Family Medicine

## 2023-06-24 DIAGNOSIS — I10 Essential (primary) hypertension: Secondary | ICD-10-CM

## 2023-06-24 DIAGNOSIS — E785 Hyperlipidemia, unspecified: Secondary | ICD-10-CM

## 2023-07-28 ENCOUNTER — Telehealth: Payer: Self-pay | Admitting: Family Medicine

## 2023-07-28 DIAGNOSIS — R972 Elevated prostate specific antigen [PSA]: Secondary | ICD-10-CM

## 2023-07-28 DIAGNOSIS — E785 Hyperlipidemia, unspecified: Secondary | ICD-10-CM

## 2023-07-28 DIAGNOSIS — I10 Essential (primary) hypertension: Secondary | ICD-10-CM

## 2023-07-28 DIAGNOSIS — R7303 Prediabetes: Secondary | ICD-10-CM

## 2023-07-28 NOTE — Telephone Encounter (Signed)
 Future orders placed

## 2023-07-28 NOTE — Telephone Encounter (Signed)
 Patient has appt 08-02-2023 for labs and needs orders in. Appt with [facility] 7-28.

## 2023-08-02 ENCOUNTER — Other Ambulatory Visit

## 2023-08-02 DIAGNOSIS — E785 Hyperlipidemia, unspecified: Secondary | ICD-10-CM | POA: Diagnosis not present

## 2023-08-02 DIAGNOSIS — R7303 Prediabetes: Secondary | ICD-10-CM

## 2023-08-02 DIAGNOSIS — I10 Essential (primary) hypertension: Secondary | ICD-10-CM

## 2023-08-02 DIAGNOSIS — R972 Elevated prostate specific antigen [PSA]: Secondary | ICD-10-CM

## 2023-08-02 LAB — BAYER DCA HB A1C WAIVED: HB A1C (BAYER DCA - WAIVED): 6.1 % — ABNORMAL HIGH (ref 4.8–5.6)

## 2023-08-02 LAB — LIPID PANEL

## 2023-08-03 ENCOUNTER — Ambulatory Visit: Admitting: Family Medicine

## 2023-08-03 LAB — COMPREHENSIVE METABOLIC PANEL WITH GFR
ALT: 12 IU/L (ref 0–44)
AST: 18 IU/L (ref 0–40)
Albumin: 4.4 g/dL (ref 3.8–4.8)
Alkaline Phosphatase: 69 IU/L (ref 44–121)
BUN/Creatinine Ratio: 20 (ref 10–24)
BUN: 21 mg/dL (ref 8–27)
Bilirubin Total: 0.4 mg/dL (ref 0.0–1.2)
CO2: 21 mmol/L (ref 20–29)
Calcium: 9.4 mg/dL (ref 8.6–10.2)
Chloride: 104 mmol/L (ref 96–106)
Creatinine, Ser: 1.06 mg/dL (ref 0.76–1.27)
Globulin, Total: 2.7 g/dL (ref 1.5–4.5)
Glucose: 116 mg/dL — AB (ref 70–99)
Potassium: 3.7 mmol/L (ref 3.5–5.2)
Sodium: 141 mmol/L (ref 134–144)
Total Protein: 7.1 g/dL (ref 6.0–8.5)
eGFR: 74 mL/min/1.73 (ref >59)

## 2023-08-03 LAB — PSA, TOTAL AND FREE
PSA, Free Pct: 22.2
PSA, Free: 0.82 ng/mL
Prostate Specific Ag, Serum: 3.7 ng/mL (ref 0.0–4.0)

## 2023-08-03 LAB — CBC WITH DIFFERENTIAL/PLATELET
Basophils Absolute: 0 x10E3/uL (ref 0.0–0.2)
Basos: 0 %
EOS (ABSOLUTE): 0.1 x10E3/uL (ref 0.0–0.4)
Eos: 2 %
Hematocrit: 41.1 % (ref 37.5–51.0)
Hemoglobin: 13.7 g/dL (ref 13.0–17.7)
Immature Grans (Abs): 0 x10E3/uL (ref 0.0–0.1)
Immature Granulocytes: 0 %
Lymphocytes Absolute: 1.7 x10E3/uL (ref 0.7–3.1)
Lymphs: 34 %
MCH: 31.5 pg (ref 26.6–33.0)
MCHC: 33.3 g/dL (ref 31.5–35.7)
MCV: 95 fL (ref 79–97)
Monocytes Absolute: 0.4 x10E3/uL (ref 0.1–0.9)
Monocytes: 9 %
Neutrophils Absolute: 2.7 x10E3/uL (ref 1.4–7.0)
Neutrophils: 55 %
Platelets: 165 x10E3/uL (ref 150–450)
RBC: 4.35 x10E6/uL (ref 4.14–5.80)
RDW: 12.4 % (ref 11.6–15.4)
WBC: 4.9 x10E3/uL (ref 3.4–10.8)

## 2023-08-03 LAB — LIPID PANEL
Cholesterol, Total: 118 mg/dL (ref 100–199)
HDL: 33 mg/dL — AB (ref >39)
LDL CALC COMMENT:: 3.6 ratio (ref 0.0–5.0)
LDL Chol Calc (NIH): 68 mg/dL (ref 0–99)
Triglycerides: 89 mg/dL (ref 0–149)
VLDL Cholesterol Cal: 17 mg/dL (ref 5–40)

## 2023-08-07 ENCOUNTER — Ambulatory Visit (INDEPENDENT_AMBULATORY_CARE_PROVIDER_SITE_OTHER): Admitting: Family Medicine

## 2023-08-07 ENCOUNTER — Encounter: Payer: Self-pay | Admitting: Family Medicine

## 2023-08-07 VITALS — BP 111/66 | HR 82 | Ht 68.0 in | Wt 192.0 lb

## 2023-08-07 DIAGNOSIS — E785 Hyperlipidemia, unspecified: Secondary | ICD-10-CM | POA: Diagnosis not present

## 2023-08-07 DIAGNOSIS — R7303 Prediabetes: Secondary | ICD-10-CM

## 2023-08-07 DIAGNOSIS — N401 Enlarged prostate with lower urinary tract symptoms: Secondary | ICD-10-CM

## 2023-08-07 DIAGNOSIS — I10 Essential (primary) hypertension: Secondary | ICD-10-CM

## 2023-08-07 DIAGNOSIS — K219 Gastro-esophageal reflux disease without esophagitis: Secondary | ICD-10-CM

## 2023-08-07 DIAGNOSIS — R3912 Poor urinary stream: Secondary | ICD-10-CM | POA: Diagnosis not present

## 2023-08-07 MED ORDER — HYDROCHLOROTHIAZIDE 25 MG PO TABS: 25.0000 mg | ORAL_TABLET | Freq: Every day | ORAL | 3 refills | Status: AC

## 2023-08-07 MED ORDER — TAMSULOSIN HCL 0.4 MG PO CAPS: 0.4000 mg | ORAL_CAPSULE | Freq: Every day | ORAL | 3 refills | Status: AC

## 2023-08-07 MED ORDER — LISINOPRIL 40 MG PO TABS
40.0000 mg | ORAL_TABLET | Freq: Every day | ORAL | 3 refills | Status: AC
Start: 2023-08-07 — End: ?

## 2023-08-07 MED ORDER — OMEPRAZOLE 40 MG PO CPDR: 40.0000 mg | DELAYED_RELEASE_CAPSULE | Freq: Every day | ORAL | 3 refills | Status: AC

## 2023-08-07 MED ORDER — PRAVASTATIN SODIUM 40 MG PO TABS: 40.0000 mg | ORAL_TABLET | Freq: Every day | ORAL | 3 refills | Status: AC

## 2023-08-07 NOTE — Progress Notes (Signed)
 BP 111/66   Pulse 82   Ht 5' 8 (1.727 m)   Wt 192 lb (87.1 kg)   SpO2 97%   BMI 29.19 kg/m    Subjective:   Patient ID: Johnathan [name], male    DOB: 03/24/1950, 73 y.o.   MRN: [id number]  HPI: Johnathan [name] is a 73 y.o. male presenting on 08/07/2023 for Medical Management of Chronic Issues, Hyperlipidemia, and Hypertension   HPI Hypertension Patient is currently on lisinopril  and hydrochlorothiazide , and their blood pressure today is 111/66. Patient denies any lightheadedness or dizziness. Patient denies headaches, blurred vision, chest pains, shortness of breath, or weakness. Denies any side effects from medication and is content with current medication.   Hyperlipidemia Patient is coming in for recheck of his hyperlipidemia. The patient is currently taking pravastatin . They deny any issues with myalgias or history of liver damage from it. They deny any focal numbness or weakness or chest pain.   Prediabetes Patient comes in today for recheck of his diabetes. Patient has been currently taking no medicine currently, diet control. Patient is currently on an ACE inhibitor/ARB. Patient has not seen an ophthalmologist this year. Patient denies any new issues with their feet. The symptom started onset as an adult hypertension and hyperlipidemia ARE RELATED TO DM   GERD Patient is currently on omeprazole .  She denies any major symptoms or abdominal pain or belching or burping. She denies any blood in her stool or lightheadedness or dizziness.   Relevant past medical, surgical, family and social history reviewed and updated as indicated. Interim medical history since our last visit reviewed. Allergies and medications reviewed and updated.  Review of Systems  Constitutional:  Negative for chills and fever.  Eyes:  Negative for discharge.  Respiratory:  Negative for shortness of breath and wheezing.   Cardiovascular:  Negative for chest pain and leg swelling.  Musculoskeletal:   Negative for back pain and gait problem.  Skin:  Negative for rash.  All other systems reviewed and are negative.   Per HPI unless specifically indicated above   Allergies as of 08/07/2023   No Known Allergies      Medication List        Accurate as of August 07, 2023 11:58 AM. If you have any questions, ask your nurse or doctor.          STOP taking these medications    azithromycin  250 MG tablet Commonly known as: Zithromax  Z-Pak Stopped by: Fonda LABOR Libby Goehring       TAKE these medications    fluticasone  50 MCG/ACT nasal spray Commonly known as: FLONASE  SPRAY 2 SPRAYS INTO EACH NOSTRIL EVERY DAY   hydrochlorothiazide  25 MG tablet Commonly known as: HYDRODIURIL  Take 1 tablet (25 mg total) by mouth daily.   levocetirizine 5 MG tablet Commonly known as: XYZAL  Take 1 tablet (5 mg total) by mouth every evening.   lisinopril  40 MG tablet Commonly known as: ZESTRIL  Take 1 tablet (40 mg total) by mouth daily.   meclizine  25 MG tablet Commonly known as: ANTIVERT  Take 1 tablet (25 mg total) by mouth 3 (three) times daily as needed for dizziness.   metformin 500 MG (OSM) 24 hr tablet Commonly known as: FORTAMET Take 500 mg by mouth 2 (two) times daily with a meal.   omeprazole  40 MG capsule Commonly known as: PRILOSEC Take 1 capsule (40 mg total) by mouth daily. Dx Code K21.9   pravastatin  40 MG tablet Commonly known as: PRAVACHOL  Take  1 tablet (40 mg total) by mouth daily.   sildenafil  20 MG tablet Commonly known as: REVATIO  Take 1-5 tablets (20-100 mg total) by mouth as needed.   tamsulosin  0.4 MG Caps capsule Commonly known as: FLOMAX  Take 1 capsule (0.4 mg total) by mouth daily.         Objective:   BP 111/66   Pulse 82   Ht 5' 8 (1.727 m)   Wt 192 lb (87.1 kg)   SpO2 97%   BMI 29.19 kg/m   Wt Readings from Last 3 Encounters:  08/07/23 192 lb (87.1 kg)  05/18/23 205 lb (93 kg)  12/02/22 200 lb (90.7 kg)    Physical Exam Vitals and  nursing note reviewed.  Constitutional:      General: He is not in acute distress.    Appearance: He is well-developed. He is not diaphoretic.  Eyes:     General: No scleral icterus.    Conjunctiva/sclera: Conjunctivae normal.  Neck:     Thyroid : No thyromegaly.  Cardiovascular:     Rate and Rhythm: Normal rate and regular rhythm.     Heart sounds: Normal heart sounds. No murmur heard. Pulmonary:     Effort: Pulmonary effort is normal. No respiratory distress.     Breath sounds: Normal breath sounds. No wheezing.  Musculoskeletal:        General: Normal range of motion.     Cervical back: Neck supple.  Lymphadenopathy:     Cervical: No cervical adenopathy.  Skin:    General: Skin is warm and dry.     Findings: No rash.  Neurological:     Mental Status: He is alert and oriented to person, place, and time.     Coordination: Coordination normal.  Psychiatric:        Behavior: Behavior normal.     Results for orders placed or performed in visit on 08/02/23  Bayer DCA Hb A1c Waived   Collection Time: 08/02/23  9:55 AM  Result Value Ref Range   HB A1C (BAYER DCA - WAIVED) 6.1 (H) 4.8 - 5.6 %  PSA, total and free   Collection Time: 08/02/23  9:57 AM  Result Value Ref Range   Prostate Specific Ag, Serum 3.7 0.0 - 4.0 ng/mL   PSA, Free 0.82 N/A ng/mL   PSA, Free Pct 22.2 %  Lipid panel   Collection Time: 08/02/23  9:57 AM  Result Value Ref Range   Cholesterol, Total 118 100 - 199 mg/dL   Triglycerides 89 0 - 149 mg/dL   HDL 33 (L) >60 mg/dL   VLDL Cholesterol Cal 17 5 - 40 mg/dL   LDL Chol Calc (NIH) 68 0 - 99 mg/dL   Chol/HDL Ratio 3.6 0.0 - 5.0 ratio  Comprehensive metabolic panel with GFR   Collection Time: 08/02/23  9:57 AM  Result Value Ref Range   Glucose 116 (H) 70 - 99 mg/dL   BUN 21 8 - 27 mg/dL   Creatinine, Ser 8.93 0.76 - 1.27 mg/dL   eGFR 74 >40 fO/fpw/8.26   BUN/Creatinine Ratio 20 10 - 24   Sodium 141 134 - 144 mmol/L   Potassium 3.7 3.5 - 5.2 mmol/L    Chloride 104 96 - 106 mmol/L   CO2 21 20 - 29 mmol/L   Calcium  9.4 8.6 - 10.2 mg/dL   Total Protein 7.1 6.0 - 8.5 g/dL   Albumin 4.4 3.8 - 4.8 g/dL   Globulin, Total 2.7 1.5 - 4.5 g/dL  Bilirubin Total 0.4 0.0 - 1.2 mg/dL   Alkaline Phosphatase 69 44 - 121 IU/L   AST 18 0 - 40 IU/L   ALT 12 0 - 44 IU/L  CBC with Differential/Platelet   Collection Time: 08/02/23  9:57 AM  Result Value Ref Range   WBC 4.9 3.4 - 10.8 x10E3/uL   RBC 4.35 4.14 - 5.80 x10E6/uL   Hemoglobin 13.7 13.0 - 17.7 g/dL   Hematocrit 58.8 62.4 - 51.0 %   MCV 95 79 - 97 fL   MCH 31.5 26.6 - 33.0 pg   MCHC 33.3 31.5 - 35.7 g/dL   RDW 87.5 88.3 - 84.5 %   Platelets 165 150 - 450 x10E3/uL   Neutrophils 55 Not Estab. %   Lymphs 34 Not Estab. %   Monocytes 9 Not Estab. %   Eos 2 Not Estab. %   Basos 0 Not Estab. %   Neutrophils Absolute 2.7 1.4 - 7.0 x10E3/uL   Lymphocytes Absolute 1.7 0.7 - 3.1 x10E3/uL   Monocytes Absolute 0.4 0.1 - 0.9 x10E3/uL   EOS (ABSOLUTE) 0.1 0.0 - 0.4 x10E3/uL   Basophils Absolute 0.0 0.0 - 0.2 x10E3/uL   Immature Granulocytes 0 Not Estab. %   Immature Grans (Abs) 0.0 0.0 - 0.1 x10E3/uL    Assessment & Plan:   Problem List Items Addressed This Visit       Cardiovascular and Mediastinum   Essential hypertension - Primary   Relevant Medications   hydrochlorothiazide  (HYDRODIURIL ) 25 MG tablet   lisinopril  (ZESTRIL ) 40 MG tablet   pravastatin  (PRAVACHOL ) 40 MG tablet     Digestive   GERD (gastroesophageal reflux disease)   Relevant Medications   omeprazole  (PRILOSEC) 40 MG capsule     Other   Hyperlipidemia LDL goal <130   Relevant Medications   hydrochlorothiazide  (HYDRODIURIL ) 25 MG tablet   lisinopril  (ZESTRIL ) 40 MG tablet   pravastatin  (PRAVACHOL ) 40 MG tablet   Prediabetes   Other Visit Diagnoses       Benign prostatic hyperplasia with weak urinary stream       Relevant Medications   tamsulosin  (FLOMAX ) 0.4 MG CAPS capsule       Blood work looks good,  blood pressure looks good as well.  No changes, will see him back in 6 months. Follow up plan: Return in about 6 months (around 02/07/2024), or if symptoms worsen or fail to improve, for Physical exam and hypertension..  Counseling provided for all of the vaccine components No orders of the defined types were placed in this encounter.   Fonda Levins, MD [facility] 08/07/2023, 11:58 AM

## 2023-10-19 ENCOUNTER — Encounter: Payer: Self-pay | Admitting: Family Medicine

## 2023-10-19 ENCOUNTER — Ambulatory Visit: Admitting: Family Medicine

## 2023-10-19 ENCOUNTER — Ambulatory Visit: Payer: Self-pay

## 2023-10-19 VITALS — BP 109/62 | HR 88 | Temp 98.8°F | Ht 68.0 in | Wt 187.0 lb

## 2023-10-19 DIAGNOSIS — J209 Acute bronchitis, unspecified: Secondary | ICD-10-CM

## 2023-10-19 MED ORDER — BENZONATATE 200 MG PO CAPS: 200.0000 mg | ORAL_CAPSULE | Freq: Two times a day (BID) | ORAL | 0 refills | Status: AC | PRN

## 2023-10-19 MED ORDER — PREDNISONE 10 MG (21) PO TBPK: ORAL_TABLET | ORAL | 0 refills | Status: AC

## 2023-10-19 MED ORDER — AZITHROMYCIN 250 MG PO TABS: ORAL_TABLET | ORAL | 0 refills | Status: AC

## 2023-10-19 NOTE — Progress Notes (Signed)
 Acute Office Visit  Subjective:     Patient ID: Johnathan [name], male    DOB: June 12, 1950, 73 y.o.   MRN: [id number]  Chief Complaint  Patient presents with   Cough    HPI  History of Present Illness   Johnathan [name] is a 73 year old male who presents with a two-week history of cough.  Cough and respiratory symptoms - Dry cough for approximately two weeks, initially improved but then worsened - Having coughing fits regularly - Occasionally productive cough, though not significantly - Mild shortness of breath and wheezing, particularly with coughing or physical activity - No chest pain, fever, ear pain, headaches, or nasal congestion - Slight sore throat - No nausea, vomiting, or diarrhea - No unusual fatigue  Symptom management and relevant history - Using Mucinex  and Nyquil without significant improvement - No history of asthma, COPD, or pneumonia - Non-smoker       ROS As per HPI.      Objective:    BP 109/62   Pulse 88   Temp 98.8 F (37.1 C) (Temporal)   Ht 5' 8 (1.727 m)   Wt 187 lb (84.8 kg)   SpO2 97%   BMI 28.43 kg/m    Physical Exam Vitals and nursing note reviewed.  Constitutional:      General: He is not in acute distress.    Appearance: Normal appearance. He is not ill-appearing, toxic-appearing or diaphoretic.  HENT:     Right Ear: Tympanic membrane, ear canal and external ear normal.     Left Ear: Tympanic membrane, ear canal and external ear normal.     Nose: Nose normal.     Mouth/Throat:     Mouth: Mucous membranes are moist.     Pharynx: Posterior oropharyngeal erythema present. No pharyngeal swelling, oropharyngeal exudate, uvula swelling or postnasal drip.     Tonsils: No tonsillar exudate. 1+ on the right. 1+ on the left.  Eyes:     General:        Right eye: No discharge.        Left eye: No discharge.     Conjunctiva/sclera: Conjunctivae normal.  Cardiovascular:     Rate and Rhythm: Normal rate and regular rhythm.      Pulses: Normal pulses.     Heart sounds: Normal heart sounds. No murmur heard. Pulmonary:     Effort: Pulmonary effort is normal. No respiratory distress.     Breath sounds: Normal breath sounds. No wheezing, rhonchi or rales.  Musculoskeletal:     Cervical back: Neck supple.     Right lower leg: No edema.     Left lower leg: No edema.  Skin:    General: Skin is warm and dry.  Neurological:     General: No focal deficit present.     Mental Status: He is alert and oriented to person, place, and time.  Psychiatric:        Mood and Affect: Mood normal.        Behavior: Behavior normal.     No results found for any visits on 10/19/23.      Assessment & Plan:   Haley was seen today for cough.  Diagnoses and all orders for this visit:  Acute bronchitis, unspecified organism -     azithromycin  (ZITHROMAX  Z-PAK) 250 MG tablet; As directed -     predniSONE (STERAPRED UNI-PAK 21 TAB) 10 MG (21) TBPK tablet; Use as directed on back of pill pack -  benzonatate  (TESSALON ) 200 MG capsule; Take 1 capsule (200 mg total) by mouth 2 (two) times daily as needed for cough.     Acute bronchitis Two-week history of dry cough with mild dyspnea and wheezing. Clinical presentation consistent with bronchitis. Lungs clear on exam.  - Prescribed Z-Pak (azithromycin ). - Prescribed prednisone taper: 6 tablets on day 1, then 5, 4, 3, 2, 1 on subsequent days. - Consider Tessalon  Perles for cough if covered by insurance. - Advised continuation of OTC cough medications if Tessalon  Perles not covered. - Instructed to report if symptoms do not improve, or if fever or increased dyspnea develops.     Return to office for new or worsening symptoms, or if symptoms persist.   The patient indicates understanding of these issues and agrees with the plan.  Annabella CHRISTELLA Search, FNP

## 2023-10-19 NOTE — Telephone Encounter (Signed)
 Patient scheduled to be seen today for these issues.

## 2023-10-19 NOTE — Telephone Encounter (Signed)
 FYI Only or Action Required?: FYI only for provider.  Patient was last seen in primary care on 08/07/2023 by Dettinger, Fonda LABOR, MD.  Called Nurse Triage reporting Cough.  Symptoms began several weeks ago.  Interventions attempted: OTC medications: cough medicine and Rest, hydration, or home remedies.  Symptoms are: gradually worsening.  Triage Disposition: See HCP Within 4 Hours (Or PCP Triage)  Patient/caregiver understands and will follow disposition?: Yes, but will wait   Copied from CRM [id number]. Topic: Clinical - Red Word Triage >> Oct 19, 2023  9:24 AM Donna BRAVO wrote: Red Word that prompted transfer to Nurse Triage: patient wife Montie calling, patient has cough, chest congestion, having a hard time breathing a little, dizzy once in a while, week 2 of this, not getting better, getting worse Reason for Disposition  MILD difficulty breathing (e.g., minimal/no SOB at rest, SOB with walking, pulse < 100)  Answer Assessment - Initial Assessment Questions 1. SYMPTOMS: What is your main symptom or concern? (e.g., cough, fever, shortness of breath, muscle aches)     Cough  2. ONSET: When did the symptoms start?      2 weeks  3. COUGH: Do you have a cough? If Yes, ask: How bad is the cough?       Productive  4. FEVER: Do you have a fever? If Yes, ask: What is your temperature, how was it measured, and when did it start?     Denies  5. BREATHING DIFFICULTY: Are you having any difficulty breathing? (e.g., normal; shortness of breath, wheezing, unable to speak)      Occasional shortness of breath 6. BETTER-SAME-WORSE: Are you getting better, staying the same or getting worse compared to yesterday?  If getting worse, ask, In what way?     Worse  7. OTHER SYMPTOMS: Do you have any other symptoms?  (e.g., chills, fatigue, headache, loss of smell or taste, muscle pain, sore throat)     Congestion  8. INFLUENZA EXPOSURE: Was there any known exposure to influenza (flu)  before the symptoms began?      unsure 9. INFLUENZA SUSPECTED: Why do you think you have influenza? (e.g., positive flu self-test at home, symptoms after exposure).     unsure 10. INFLUENZA VACCINE: Have you had the flu vaccine? If Yes, ask: When did you last get it?       unsure 11. HIGH RISK FOR COMPLICATIONS: Do you have any chronic medical problems? (e.g., asthma, heart or lung disease, obesity, weak immune system)        12. PREGNANCY: Is there any chance you are pregnant? When was your last menstrual period?        13. O2 SATURATION MONITOR:  Do you use an oxygen saturation monitor (pulse oximeter) at home? If Yes, ask What is your reading (oxygen level) today? What is your usual oxygen saturation reading? (e.g., 95%)  Protocols used: Influenza (Flu) Suspected-A-AH

## 2023-12-05 ENCOUNTER — Ambulatory Visit: Payer: Medicare HMO

## 2023-12-05 VITALS — BP 109/62 | HR 88 | Ht 68.0 in | Wt 187.0 lb

## 2023-12-05 DIAGNOSIS — Z0000 Encounter for general adult medical examination without abnormal findings: Secondary | ICD-10-CM

## 2023-12-05 NOTE — Progress Notes (Signed)
 Chief Complaint  Patient presents with   Medicare Wellness     Subjective:   Johnathan [name] is a 73 y.o. male who presents for a Medicare Annual Wellness Visit.  Allergies (verified) Patient has no known allergies.   History: Past Medical History:  Diagnosis Date   GERD (gastroesophageal reflux disease)    Hyperlipidemia    Hypertension    Past Surgical History:  Procedure Laterality Date   CATARACT EXTRACTION     EXCISION OF SKIN TAG N/A 12/28/2015   Procedure: EXCISION OF ANAL  SKIN TAGS AND 3 SKIN TAGS LEFT THIGH;  Surgeon: Dann Hummer, MD;  Location: [facility];  Service: General;  Laterality: N/A;   HEMORRHOID SURGERY  12/28/2015   Procedure: HEMORRHOIDECTOMY x2;  Surgeon: Dann Hummer, MD;  Location: [facility];  Service: General;;   POLYPECTOMY     from vocal cords   Family History  Problem Relation Age of Onset   Cancer Mother        breast   Hepatitis C Brother    Social History   Occupational History   Occupation: Retired  Tobacco Use   Smoking status: Never   Smokeless tobacco: Never  Vaping Use   Vaping status: Never Used  Substance and Sexual Activity   Alcohol use: No   Drug use: No   Sexual activity: Yes   Tobacco Counseling Counseling given: Yes  SDOH Screenings   Food Insecurity: No Food Insecurity (12/05/2023)  Housing: Unknown (12/05/2023)  Transportation Needs: No Transportation Needs (12/05/2023)  Utilities: Not At Risk (12/05/2023)  Alcohol Screen: Low Risk  (12/02/2022)  Depression (PHQ2-9): Low Risk  (12/05/2023)  Financial Resource Strain: Low Risk  (12/02/2022)  Physical Activity: Sufficiently Active (12/05/2023)  Social Connections: Socially Integrated (12/05/2023)  Stress: No Stress Concern Present (12/05/2023)  Tobacco Use: Low Risk  (12/05/2023)  Health Literacy: Adequate Health Literacy (12/05/2023)   See flowsheets for full screening details  Depression Screen PHQ 2 & 9  Depression Scale- Over the past 2 weeks, how often have you been bothered by any of the following problems? Little interest or pleasure in doing things: 0 Feeling down, depressed, or hopeless (PHQ Adolescent also includes...irritable): 0 PHQ-2 Total Score: 0 Trouble falling or staying asleep, or sleeping too much: 0 Feeling tired or having little energy: 0 Poor appetite or overeating (PHQ Adolescent also includes...weight loss): 0 Feeling bad about yourself - or that you are a failure or have let yourself or your family down: 0 Trouble concentrating on things, such as reading the newspaper or watching television (PHQ Adolescent also includes...like school work): 0 Moving or speaking so slowly that other people could have noticed. Or the opposite - being so fidgety or restless that you have been moving around a lot more than usual: 0 Thoughts that you would be better off dead, or of hurting yourself in some way: 0 PHQ-9 Total Score: 0 If you checked off any problems, how difficult have these problems made it for you to do your work, take care of things at home, or get along with other people?: Not difficult at all     Goals Addressed             This Visit's Progress    Patient Stated   On track    Remain active and healthy        Visit info / Clinical Intake: Medicare Wellness Visit Type:: Subsequent Annual Wellness Visit Persons participating in visit:: patient  Medicare Wellness Visit Mode:: Telephone If telephone:: video declined Because this visit was a virtual/telehealth visit:: vitals recorded from last visit If Telephone or Video please confirm:: I connected with the patient using audio enabled telemedicine application and verified that I am speaking with the correct person using two identifiers Patient Location:: home Provider Location:: home office Information given by:: patient Interpreter Needed?: No Pre-visit prep was completed: yes AWV questionnaire completed by  patient prior to visit?: no Living arrangements:: lives with spouse/significant other Patient's Overall Health Status Rating: very good Typical amount of pain: none Does pain affect daily life?: no Are you currently prescribed opioids?: no  Dietary Habits and Nutritional Risks How many meals a day?: 3 Eats fruit and vegetables daily?: yes Most meals are obtained by: preparing own meals Diabetic:: no Any non-healing wounds?: no How often do you check your BS?: 0 Would you like to be referred to a Nutritionist or for Diabetic Management? : no  Functional Status Activities of Daily Living (to include ambulation/medication): Independent Ambulation: Independent Medication Administration: Independent Home Management: Independent Manage your own finances?: yes Primary transportation is: driving Concerns about hearing?: no  Fall Screening Falls in the past year?: 0 Number of falls in past year: 0 Was there an injury with Fall?: 0 Fall Risk Category Calculator: 0 Patient Fall Risk Level: Low Fall Risk  Fall Risk Patient at Risk for Falls Due to: No Fall Risks Fall risk Follow up: Falls evaluation completed; Education provided  Home and Transportation Safety: All rugs have non-skid backing?: yes All stairs or steps have railings?: yes Grab bars in the bathtub or shower?: (!) no Have non-skid surface in bathtub or shower?: yes Good home lighting?: yes Regular seat belt use?: yes Hospital stays in the last year:: no  Cognitive Assessment Difficulty concentrating, remembering, or making decisions? : no Will 6CIT or Mini Cog be Completed: yes What year is it?: 0 points What month is it?: 0 points Give patient an address phrase to remember (5 components): [address] [place] About what time is it?: 0 points Count backwards from 20 to 1: 0 points Say the months of the year in reverse: 0 points Repeat the address phrase from earlier: 0 points 6 CIT Score: 0 points  Advance  Directives (For Healthcare) Does Patient Have a Medical Advance Directive?: No Would patient like information on creating a medical advance directive?: No - Patient declined  Reviewed/Updated  Reviewed/Updated: Reviewed All (Medical, Surgical, Family, Medications, Allergies, Care Teams, Patient Goals); Medical History; Surgical History; Family History; Medications; Allergies; Care Teams; Patient Goals        Objective:    Today's Vitals   12/05/23 1342  BP: 109/62  Pulse: 88  Weight: 187 lb (84.8 kg)  Height: 5' 8 (1.727 m)   Body mass index is 28.43 kg/m.  Current Medications (verified) Outpatient Encounter Medications as of 12/05/2023  Medication Sig   fluticasone  (FLONASE ) 50 MCG/ACT nasal spray SPRAY 2 SPRAYS INTO EACH NOSTRIL EVERY DAY   hydrochlorothiazide  (HYDRODIURIL ) 25 MG tablet Take 1 tablet (25 mg total) by mouth daily.   levocetirizine (XYZAL ) 5 MG tablet Take 1 tablet (5 mg total) by mouth every evening.   lisinopril  (ZESTRIL ) 40 MG tablet Take 1 tablet (40 mg total) by mouth daily.   meclizine  (ANTIVERT ) 25 MG tablet Take 1 tablet (25 mg total) by mouth 3 (three) times daily as needed for dizziness.   metformin (FORTAMET) 500 MG (OSM) 24 hr tablet Take 500 mg by  mouth 2 (two) times daily with a meal.   Multiple Vitamin (MULTIVITAMIN) tablet Take 1 tablet by mouth daily.   omeprazole  (PRILOSEC) 40 MG capsule Take 1 capsule (40 mg total) by mouth daily. Dx Code K21.9   pravastatin  (PRAVACHOL ) 40 MG tablet Take 1 tablet (40 mg total) by mouth daily.   predniSONE  (STERAPRED UNI-PAK 21 TAB) 10 MG (21) TBPK tablet Use as directed on back of pill pack   sildenafil  (REVATIO ) 20 MG tablet Take 1-5 tablets (20-100 mg total) by mouth as needed.   tamsulosin  (FLOMAX ) 0.4 MG CAPS capsule Take 1 capsule (0.4 mg total) by mouth daily.   azithromycin  (ZITHROMAX  Z-PAK) 250 MG tablet As directed (Patient not taking: Reported on 12/05/2023)   benzonatate  (TESSALON ) 200 MG capsule  Take 1 capsule (200 mg total) by mouth 2 (two) times daily as needed for cough. (Patient not taking: Reported on 12/05/2023)   No facility-administered encounter medications on file as of 12/05/2023.   Hearing/Vision screen Hearing Screening - Comments:: Pt has hearing aids Vision Screening - Comments:: Pt wear glasses Immunizations and Health Maintenance Health Maintenance  Topic Date Due   Colonoscopy  08/15/2016   COVID-19 Vaccine (5 - 2025-26 season) 09/11/2023   Medicare Annual Wellness (AWV)  12/04/2024   DTaP/Tdap/Td (4 - Td or Tdap) 12/19/2028   Pneumococcal Vaccine: 50+ Years  Completed   Influenza Vaccine  Completed   Hepatitis C Screening  Completed   Zoster Vaccines- Shingrix  Completed   Meningococcal B Vaccine  Aged Out        Assessment/Plan:  This is a routine wellness examination for Johnathan [name].  Patient Care Team: Dettinger, Fonda LABOR, MD as PCP - General (Family Medicine) [facility]  I have personally reviewed and noted the following in the patient's chart:   Medical and social history Use of alcohol, tobacco or illicit drugs  Current medications and supplements including opioid prescriptions. Functional ability and status Nutritional status Physical activity Advanced directives List of other physicians Hospitalizations, surgeries, and ER visits in previous 12 months Vitals Screenings to include cognitive, depression, and falls Referrals and appointments  No orders of the defined types were placed in this encounter.  In addition, I have reviewed and discussed with patient certain preventive protocols, quality metrics, and best practice recommendations. A written personalized care plan for preventive services as well as general preventive health recommendations were provided to patient.   Johnathan [name], CMA   12/05/2023   Return in 1 year (on 12/04/2024).  After Visit Summary: (MyChart) Due to this being a telephonic visit, the after  visit summary with patients personalized plan was offered to patient via MyChart   Nurse Notes: pt is aware and due the following: colonoscopy and covid, wants to discuss w/pcp

## 2024-01-29 NOTE — Progress Notes (Signed)
 Johnathan [name]                                          MRN: [id number]   01/29/2024   The [facility] Quality Team Specialist reviewed this patient medical record for the purposes of chart review for care gap closure. The following were reviewed: chart review for care gap closure-kidney health evaluation for diabetes:eGFR  and uACR.    [facility] Quality Team

## 2024-02-08 ENCOUNTER — Encounter: Payer: Self-pay | Admitting: Family Medicine

## 2024-02-19 ENCOUNTER — Other Ambulatory Visit

## 2024-02-21 ENCOUNTER — Encounter: Admitting: Family Medicine

## 2024-12-09 ENCOUNTER — Ambulatory Visit
